# Patient Record
Sex: Male | Born: 1965 | Race: Black or African American | Hispanic: No | Marital: Single | Smoking: Current every day smoker
Health system: Southern US, Community
[De-identification: ages and names within clinical notes are randomized; demographics above are authoritative.]

## PROBLEM LIST (undated history)

## (undated) DIAGNOSIS — A539 Syphilis, unspecified: Secondary | ICD-10-CM

## (undated) DIAGNOSIS — M549 Dorsalgia, unspecified: Secondary | ICD-10-CM

## (undated) DIAGNOSIS — K859 Acute pancreatitis without necrosis or infection, unspecified: Secondary | ICD-10-CM

## (undated) DIAGNOSIS — F329 Major depressive disorder, single episode, unspecified: Secondary | ICD-10-CM

## (undated) DIAGNOSIS — I10 Essential (primary) hypertension: Secondary | ICD-10-CM

## (undated) DIAGNOSIS — Z21 Asymptomatic human immunodeficiency virus [HIV] infection status: Secondary | ICD-10-CM

## (undated) DIAGNOSIS — B20 Human immunodeficiency virus [HIV] disease: Secondary | ICD-10-CM

## (undated) DIAGNOSIS — F32A Depression, unspecified: Secondary | ICD-10-CM

## (undated) DIAGNOSIS — N182 Chronic kidney disease, stage 2 (mild): Secondary | ICD-10-CM

---

## 2001-10-25 ENCOUNTER — Emergency Department (HOSPITAL_COMMUNITY): Admission: EM | Admit: 2001-10-25 | Discharge: 2001-10-25 | Payer: Self-pay | Admitting: *Deleted

## 2004-09-11 ENCOUNTER — Emergency Department: Payer: Self-pay | Admitting: Emergency Medicine

## 2004-09-14 ENCOUNTER — Emergency Department: Payer: Self-pay | Admitting: Emergency Medicine

## 2005-01-03 ENCOUNTER — Emergency Department: Payer: Self-pay | Admitting: Emergency Medicine

## 2005-01-07 ENCOUNTER — Emergency Department: Payer: Self-pay | Admitting: General Practice

## 2005-06-22 ENCOUNTER — Emergency Department: Payer: Self-pay | Admitting: Emergency Medicine

## 2005-06-26 ENCOUNTER — Emergency Department: Payer: Self-pay | Admitting: Emergency Medicine

## 2005-11-09 ENCOUNTER — Emergency Department: Payer: Self-pay | Admitting: Emergency Medicine

## 2006-03-26 ENCOUNTER — Emergency Department: Payer: Self-pay | Admitting: Emergency Medicine

## 2006-03-26 ENCOUNTER — Other Ambulatory Visit: Payer: Self-pay

## 2006-07-11 ENCOUNTER — Emergency Department (HOSPITAL_COMMUNITY): Admission: EM | Admit: 2006-07-11 | Discharge: 2006-07-11 | Payer: Self-pay | Admitting: Emergency Medicine

## 2006-09-10 ENCOUNTER — Emergency Department: Payer: Self-pay | Admitting: Emergency Medicine

## 2006-10-19 ENCOUNTER — Emergency Department: Payer: Self-pay | Admitting: Emergency Medicine

## 2009-09-22 LAB — CONVERTED CEMR LAB
Cholesterol: 182 mg/dL
HDL: 18 mg/dL
LDL Cholesterol: 141 mg/dL
Triglycerides: 115 mg/dL

## 2009-09-23 LAB — CONVERTED CEMR LAB
CD4 Count: 707 microliters
HIV-2 Ab: POSITIVE
Hepatitis B Surface Ag: NEGATIVE

## 2009-09-26 LAB — CONVERTED CEMR LAB

## 2009-10-02 LAB — CONVERTED CEMR LAB
Iron: 27 ug/dL
Saturation Ratios: 18 %
TIBC: 148 ug/dL

## 2009-10-03 LAB — CONVERTED CEMR LAB
INR: 1.1
Prothrombin Time: 11 s
aPTT: 37 s

## 2009-10-05 LAB — CONVERTED CEMR LAB
ALT: 56 units/L
AST: 32 units/L
Albumin: 1.9 g/dL
Alkaline Phosphatase: 337 units/L
BUN: 5 mg/dL
CO2: 27 meq/L
Calcium: 8 mg/dL
Chloride: 106 meq/L
Creatinine, Ser: 1.3 mg/dL
Glucose, Bld: 93 mg/dL
HCT: 24.3 %
Hemoglobin: 8.1 g/dL
LDH: 167 units/L
Lipase: 169 units/L
Platelets: 270 10*3/uL
Potassium: 3.9 meq/L
Sodium: 139 meq/L
Total Bilirubin: 0.3 mg/dL
Total Protein: 6.4 g/dL
WBC: 7.8 10*3/uL

## 2009-11-04 ENCOUNTER — Encounter: Payer: Self-pay | Admitting: Family Medicine

## 2009-11-04 ENCOUNTER — Ambulatory Visit: Payer: Self-pay | Admitting: Family Medicine

## 2009-11-04 DIAGNOSIS — B20 Human immunodeficiency virus [HIV] disease: Secondary | ICD-10-CM | POA: Insufficient documentation

## 2009-11-04 DIAGNOSIS — I10 Essential (primary) hypertension: Secondary | ICD-10-CM

## 2009-11-04 DIAGNOSIS — A539 Syphilis, unspecified: Secondary | ICD-10-CM

## 2009-11-04 DIAGNOSIS — F329 Major depressive disorder, single episode, unspecified: Secondary | ICD-10-CM

## 2009-11-04 LAB — CONVERTED CEMR LAB: Iron: 27 ug/dL

## 2009-11-21 ENCOUNTER — Encounter: Payer: Self-pay | Admitting: Internal Medicine

## 2009-11-24 ENCOUNTER — Ambulatory Visit: Payer: Self-pay | Admitting: Internal Medicine

## 2009-11-24 ENCOUNTER — Encounter (INDEPENDENT_AMBULATORY_CARE_PROVIDER_SITE_OTHER): Payer: Self-pay | Admitting: *Deleted

## 2009-11-24 ENCOUNTER — Ambulatory Visit: Payer: Self-pay | Admitting: Family Medicine

## 2009-11-24 LAB — CONVERTED CEMR LAB: LDL Cholesterol: 121 mg/dL — ABNORMAL HIGH (ref 0–99)

## 2009-12-02 LAB — CONVERTED CEMR LAB
AST: 21 units/L (ref 0–37)
Albumin: 4.7 g/dL (ref 3.5–5.2)
BUN: 21 mg/dL (ref 6–23)
CO2: 28 meq/L (ref 19–32)
Calcium: 9.8 mg/dL (ref 8.4–10.5)
Chlamydia, Swab/Urine, PCR: NEGATIVE
Chloride: 102 meq/L (ref 96–112)
Eosinophils Absolute: 0.4 10*3/uL (ref 0.0–0.7)
Eosinophils Relative: 7 % — ABNORMAL HIGH (ref 0–5)
GC Probe Amp, Urine: NEGATIVE
HCT: 42.7 % (ref 39.0–52.0)
HIV-1 antibody: POSITIVE — AB
HIV-2 Ab: UNDETERMINED — AB
Hemoglobin: 13.6 g/dL (ref 13.0–17.0)
Hep B S Ab: POSITIVE — AB
Hepatitis B Surface Ag: NEGATIVE
Lymphocytes Relative: 50 % — ABNORMAL HIGH (ref 12–46)
Lymphs Abs: 2.8 10*3/uL (ref 0.7–4.0)
MCV: 90.3 fL (ref >=78.0)
Monocytes Absolute: 0.5 10*3/uL (ref 0.1–1.0)
Monocytes Relative: 8 % (ref 3–12)
Platelets: 201 10*3/uL (ref 150–400)
Potassium: 4 meq/L (ref 3.5–5.3)
RBC: 4.73 M/uL (ref 4.22–5.81)
RPR Titer: 1:16 {titer} — AB
WBC: 5.6 10*3/uL (ref 4.0–10.5)

## 2009-12-03 ENCOUNTER — Emergency Department (HOSPITAL_COMMUNITY): Admission: EM | Admit: 2009-12-03 | Discharge: 2009-12-03 | Payer: Self-pay | Admitting: Family Medicine

## 2009-12-08 ENCOUNTER — Ambulatory Visit: Payer: Self-pay | Admitting: Internal Medicine

## 2009-12-08 DIAGNOSIS — Z8719 Personal history of other diseases of the digestive system: Secondary | ICD-10-CM | POA: Insufficient documentation

## 2009-12-11 ENCOUNTER — Ambulatory Visit: Payer: Self-pay | Admitting: Family Medicine

## 2009-12-11 DIAGNOSIS — M549 Dorsalgia, unspecified: Secondary | ICD-10-CM | POA: Insufficient documentation

## 2009-12-17 ENCOUNTER — Encounter: Payer: Self-pay | Admitting: Internal Medicine

## 2009-12-18 ENCOUNTER — Ambulatory Visit: Payer: Self-pay | Admitting: Family Medicine

## 2009-12-24 ENCOUNTER — Encounter: Admission: RE | Admit: 2009-12-24 | Discharge: 2010-02-10 | Payer: Self-pay | Admitting: Family Medicine

## 2010-01-07 ENCOUNTER — Ambulatory Visit: Payer: Self-pay | Admitting: Family Medicine

## 2010-01-07 ENCOUNTER — Encounter: Payer: Self-pay | Admitting: Family Medicine

## 2010-01-13 ENCOUNTER — Encounter: Payer: Self-pay | Admitting: Family Medicine

## 2010-01-14 ENCOUNTER — Ambulatory Visit: Payer: Self-pay | Admitting: Family Medicine

## 2010-02-06 ENCOUNTER — Encounter: Payer: Self-pay | Admitting: Family Medicine

## 2010-02-25 ENCOUNTER — Ambulatory Visit: Payer: Self-pay | Admitting: Internal Medicine

## 2010-02-25 LAB — CONVERTED CEMR LAB
HIV 1 RNA Quant: 7780 copies/mL — ABNORMAL HIGH (ref <48)
HIV-1 RNA Quant, Log: 3.89 — ABNORMAL HIGH (ref <1.68)

## 2010-02-26 LAB — CONVERTED CEMR LAB
BUN: 13 mg/dL (ref 6–23)
Basophils Relative: 1 % (ref 0–1)
CO2: 24 meq/L (ref 19–32)
Creatinine, Ser: 1.26 mg/dL (ref 0.40–1.50)
Eosinophils Absolute: 0.2 10*3/uL (ref 0.0–0.7)
Glucose, Bld: 109 mg/dL — ABNORMAL HIGH (ref 70–99)
Hemoglobin: 12.3 g/dL — ABNORMAL LOW (ref 13.0–17.0)
MCHC: 32.6 g/dL (ref 30.0–36.0)
MCV: 94.5 fL (ref 78.0–100.0)
Monocytes Absolute: 0.4 10*3/uL (ref 0.1–1.0)
Monocytes Relative: 8 % (ref 3–12)
RBC: 3.99 M/uL — ABNORMAL LOW (ref 4.22–5.81)
RPR Ser Ql: REACTIVE — AB
RPR Titer: 1:2 {titer}
T pallidum Antibodies (TP-PA): >8 — ABNORMAL HIGH (ref <0.90)
Total Bilirubin: 0.6 mg/dL (ref 0.3–1.2)
Total Protein: 8.2 g/dL (ref 6.0–8.3)

## 2010-03-11 ENCOUNTER — Ambulatory Visit: Payer: Self-pay | Admitting: Internal Medicine

## 2010-03-19 ENCOUNTER — Encounter: Payer: Self-pay | Admitting: Internal Medicine

## 2010-04-27 ENCOUNTER — Ambulatory Visit: Payer: Self-pay | Admitting: Internal Medicine

## 2010-04-27 LAB — CONVERTED CEMR LAB
ALT: 22 units/L (ref 0–53)
Alkaline Phosphatase: 38 units/L — ABNORMAL LOW (ref 39–117)
Basophils Absolute: 0 10*3/uL (ref 0.0–0.1)
CO2: 27 meq/L (ref 19–32)
Eosinophils Relative: 4 % (ref 0–5)
HCT: 41.3 % (ref 39.0–52.0)
HIV 1 RNA Quant: 9440 copies/mL — ABNORMAL HIGH (ref <48)
Lymphocytes Relative: 47 % — ABNORMAL HIGH (ref 12–46)
Platelets: 183 10*3/uL (ref 150–400)
RDW: 12.6 % (ref 11.5–15.5)
Sodium: 144 meq/L (ref 135–145)
Total Bilirubin: 0.4 mg/dL (ref 0.3–1.2)
Total Protein: 7.9 g/dL (ref 6.0–8.3)

## 2010-05-11 ENCOUNTER — Ambulatory Visit: Payer: Self-pay | Admitting: Internal Medicine

## 2010-05-15 ENCOUNTER — Encounter (INDEPENDENT_AMBULATORY_CARE_PROVIDER_SITE_OTHER): Payer: Self-pay | Admitting: *Deleted

## 2010-05-21 ENCOUNTER — Telehealth (INDEPENDENT_AMBULATORY_CARE_PROVIDER_SITE_OTHER): Payer: Self-pay | Admitting: *Deleted

## 2010-06-24 ENCOUNTER — Ambulatory Visit: Payer: Self-pay | Admitting: Internal Medicine

## 2010-06-24 ENCOUNTER — Telehealth (INDEPENDENT_AMBULATORY_CARE_PROVIDER_SITE_OTHER): Payer: Self-pay | Admitting: *Deleted

## 2010-06-24 LAB — CONVERTED CEMR LAB
ALT: 60 units/L — ABNORMAL HIGH (ref 0–53)
AST: 36 units/L (ref 0–37)
Basophils Absolute: 0 10*3/uL (ref 0.0–0.1)
Basophils Relative: 1 % (ref 0–1)
Creatinine, Ser: 1.35 mg/dL (ref 0.40–1.50)
Eosinophils Absolute: 0.2 10*3/uL (ref 0.0–0.7)
Eosinophils Relative: 5 % (ref 0–5)
HCT: 38.6 % — ABNORMAL LOW (ref 39.0–52.0)
Hemoglobin: 12.8 g/dL — ABNORMAL LOW (ref 13.0–17.0)
MCHC: 33.2 g/dL (ref 30.0–36.0)
MCV: 92.1 fL (ref 78.0–100.0)
Monocytes Absolute: 0.4 10*3/uL (ref 0.1–1.0)
RDW: 12.6 % (ref 11.5–15.5)
Total Bilirubin: 0.5 mg/dL (ref 0.3–1.2)

## 2010-07-16 ENCOUNTER — Ambulatory Visit: Payer: Self-pay | Admitting: Internal Medicine

## 2010-07-21 ENCOUNTER — Encounter: Payer: Self-pay | Admitting: Internal Medicine

## 2010-08-24 ENCOUNTER — Ambulatory Visit: Payer: Self-pay | Admitting: Internal Medicine

## 2010-09-12 IMAGING — CR DG LUMBAR SPINE COMPLETE 4+V
5 series · 5 of 5 positions shown · non-contrast
Comparison: None

CLINICAL DATA: Low back pain after lifting

LUMBAR SPINE - COMPLETE 4+ VIEW

[view not recorded (1 of 5)]
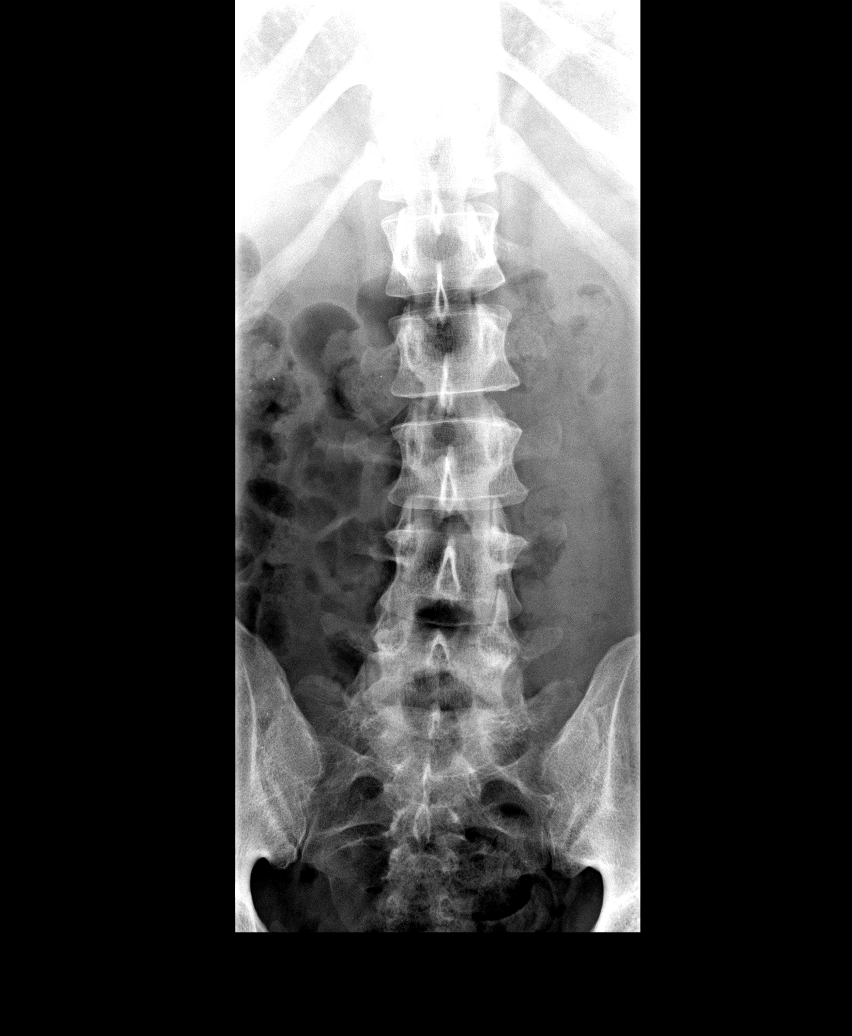

[view not recorded (2 of 5)]
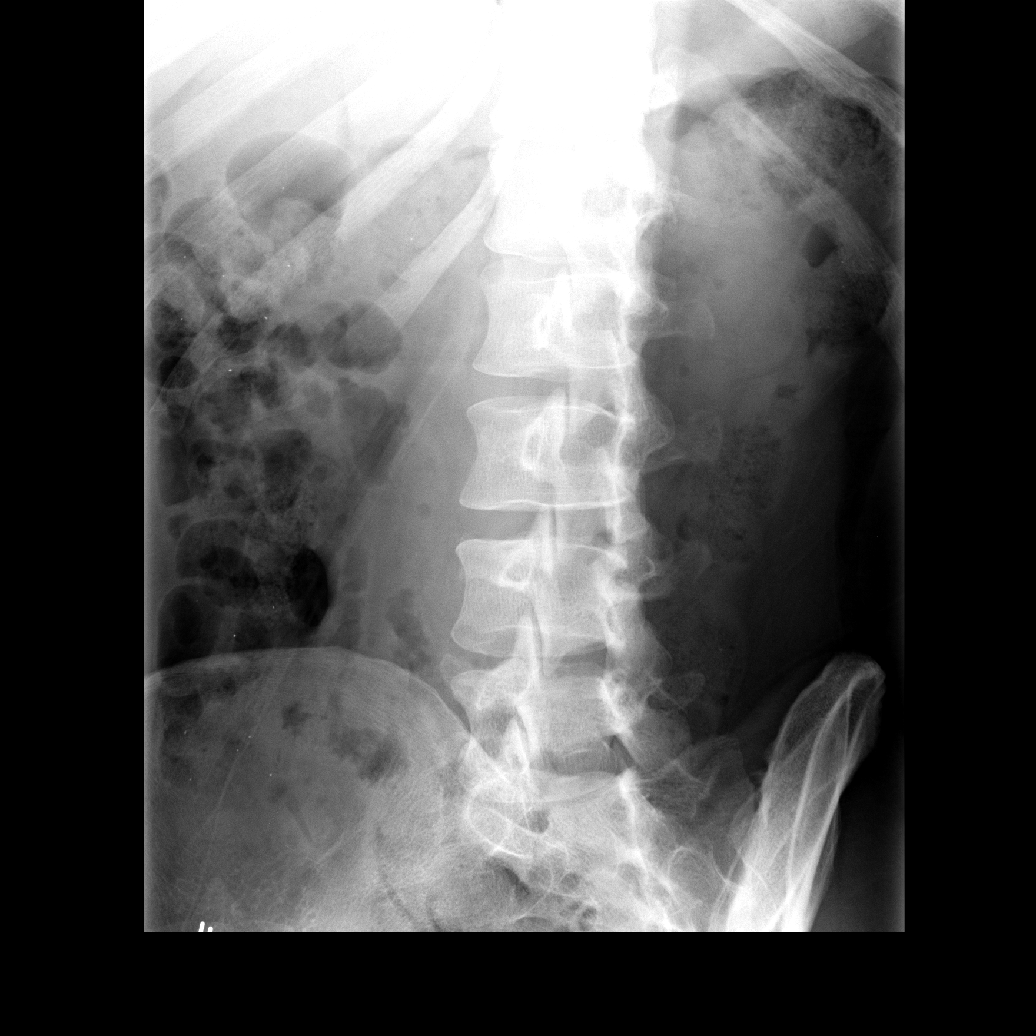

[view not recorded (3 of 5)]
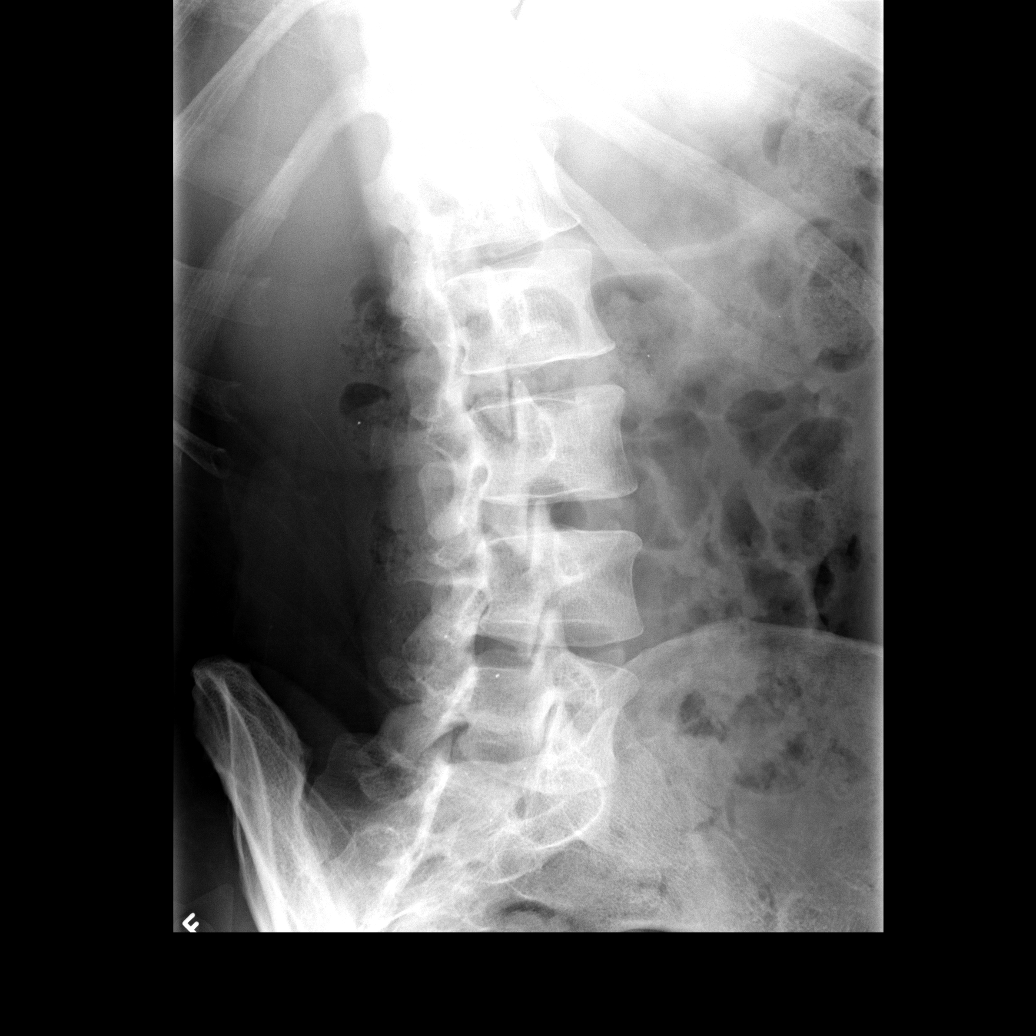

[view not recorded (4 of 5)]
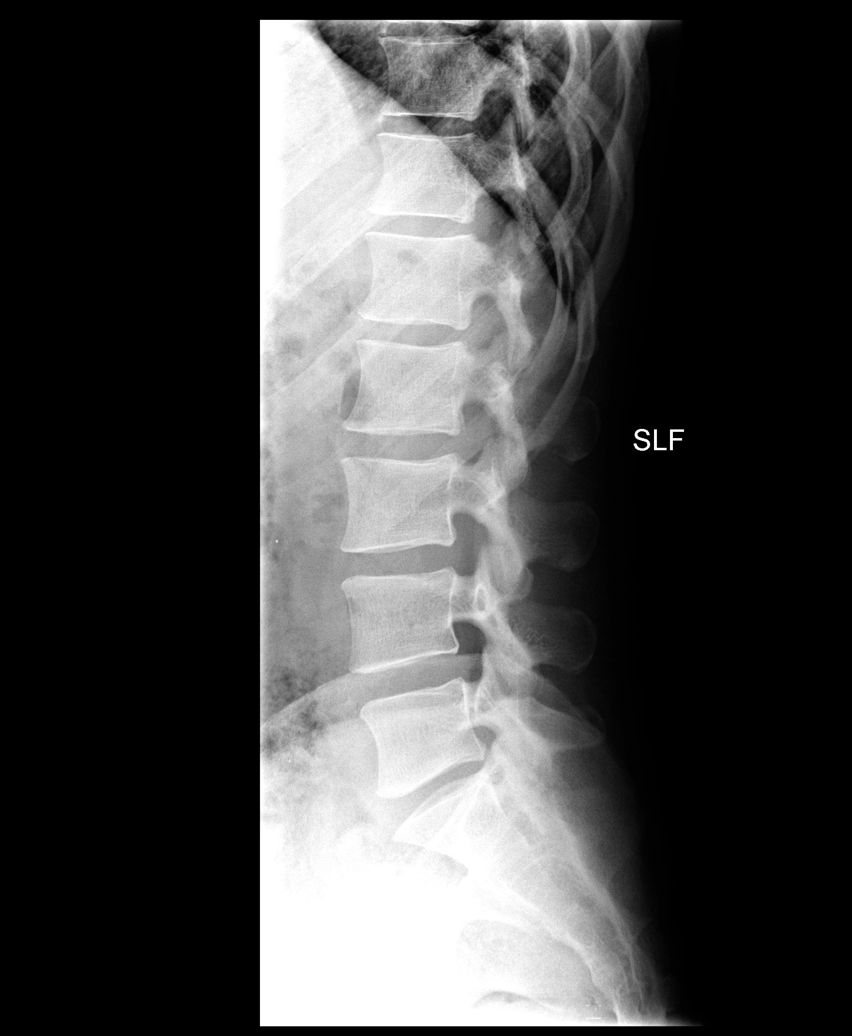

[view not recorded (5 of 5)]
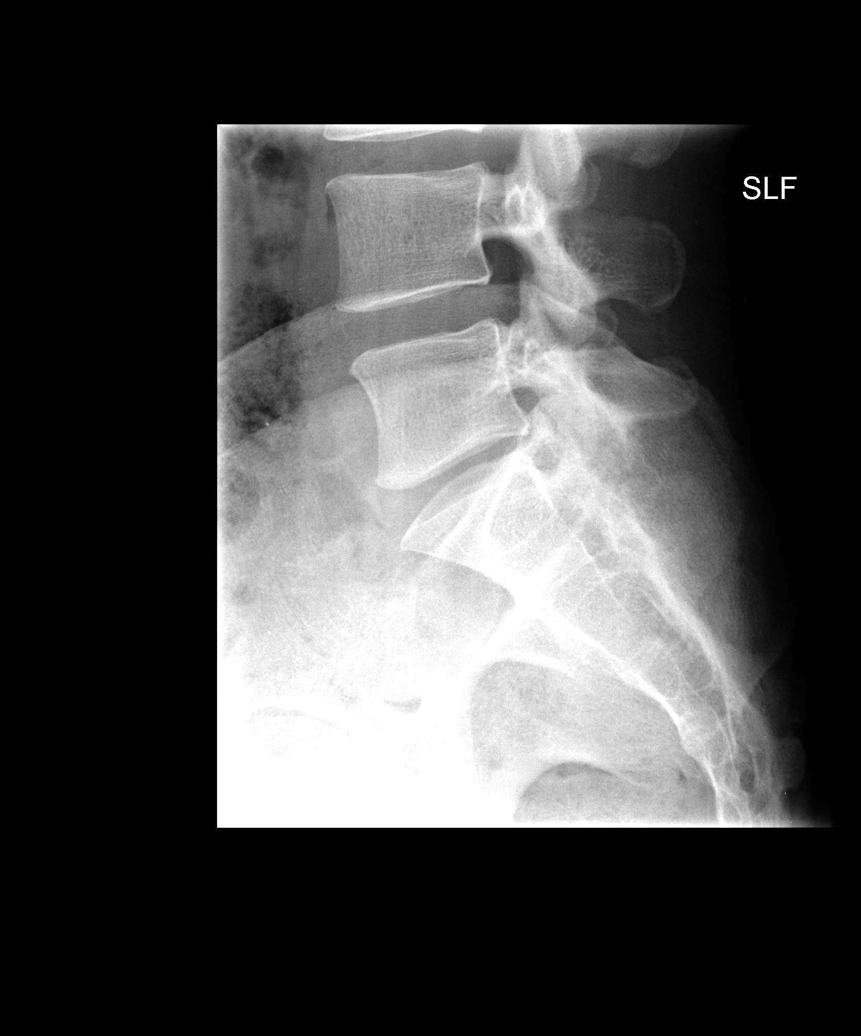

[5 of 5 positions shown; findings below may reference images not displayed]

FINDINGS: There is mild levoscoliosis.  Disc height preserved.  No
anomalies or acute bony changes.  Soft tissues unremarkable.
IMPRESSION: Normal except for mild levoscoliosis.

## 2010-09-21 ENCOUNTER — Ambulatory Visit: Admit: 2010-09-21 | Payer: Self-pay | Admitting: Internal Medicine

## 2010-10-04 ENCOUNTER — Encounter: Payer: Self-pay | Admitting: Family Medicine

## 2010-10-13 NOTE — Miscellaneous (Signed)
Summary: clinical update/ryan white Medicaid pending  Clinical Lists Changes  Observations: Added new observation of PAYOR: No Insurance (11/24/2009 11:18) Added new observation of RWTITLE: B (11/24/2009 11:18) Added new observation of AIDSDAP: No (11/24/2009 11:18) Added new observation of INCOMESOURCE: NONE (11/24/2009 11:18) Added new observation of HOUSEINCOME: 0  (11/24/2009 11:18) Added new observation of #CHILD<18 IN: No  (11/24/2009 11:18) Added new observation of FAMILYSIZE: 1  (11/24/2009 11:18) Added new observation of HOUSING: Stable/permanent  (11/24/2009 11:18) Added new observation of FINASSESSDT: 11/24/2009  (11/24/2009 11:18) Added new observation of YEARLYEXPEN: 0  (11/24/2009 11:18) Added new observation of GENDER: Male  (11/24/2009 11:18) Added new observation of MARITAL STAT: Single  (11/24/2009 11:18) Added new observation of LATINO/HISP: No  (11/24/2009 11:18) Added new observation of RACE: African American  (11/24/2009 11:18) Added new observation of REC_MESSAGE: Yes  (11/24/2009 11:18) Added new observation of RECPHONECALL: Yes  (11/24/2009 11:18) Added new observation of REC_MAIL: Yes  (11/24/2009 11:18) Added new observation of RW VITAL STA: Active  (11/24/2009 11:18) Added new observation of PATNTCOUNTY: [place]  (11/24/2009 11:18) Added new observation of RWPARTICIP: Yes  (11/24/2009 11:18)

## 2010-10-13 NOTE — Letter (Signed)
Summary: [facility]  [address]   [place], [place] [address]   Phone: [phone]  Fax: [phone]            07/16/2010   To Whom It May Concern:  Eddie Malone (DOB 18-Aug-1966) is a patient of mine. He has several medical conditions including HIV infection, Hypertension, Low Back Pain, Depression and Fatigue.  Sincerely,   Yisroel Ramming MD

## 2010-10-13 NOTE — Assessment & Plan Note (Signed)
Summary: F/U/VS   Primary Isidro Monks:  Marisue Ivan  MD  CC:  follow-up visit, lab results, B/P elevated, and pt. applying for disability and would like to discuss this.  History of Present Illness: Pt never started his Atripla but is going to as soon as he picks it up.  He c/o fatigue, depression and inablility to concentrate. He stopped seeing his Pyschiatrist. I encouraged him to schedule an appt. Pt has not taken his BP medication yet today. He is applying for disability and needs a letter listing his medical conditions.  Depression History:      The patient denies a depressed mood most of the day and a diminished interest in his usual daily activities.        The patient denies that he feels like life is not worth living, denies that he wishes that he were dead, and denies that he has thought about ending his life.        Preventive Screening-Counseling & Management  Alcohol-Tobacco     Alcohol drinks/day: 0     Smoking Status: current     Smoking Cessation Counseling: yes     Packs/Day: <0.25     Tobacco Counseling: to quit use of tobacco products  Caffeine-Diet-Exercise     Caffeine use/day: yes     Does Patient Exercise: yes     Type of exercise: walking     Exercise (avg: min/session): >60     Times/week: 5  Hep-HIV-STD-Contraception     HIV Risk: risk noted  Safety-Violence-Falls     Seat Belt Use: yes      Sexual History:  currently monogamous.        Drug Use:  never.    Comments: pt. given condoms   Updated Prior Medication List: LISINOPRIL 5 MG TABS (LISINOPRIL) 1 tab by mouth daily PAXIL 20 MG TABS (PAROXETINE HCL) 1 tab by mouth at bedtime ATRIPLA 600-200-300 MG TABS (EFAVIRENZ-EMTRICITAB-TENOFOVIR) Take 1 tablet by mouth at bedtime  Current Allergies (reviewed today): No known allergies  Past History:  Past Medical History: Last updated: 11/04/2009 HIV- dx 09/2009(CD4 1200 - 09/2009) HTN- dx 09/2009 Hx of Syphillis Depression Sickle  Cell???  Review of Systems  The patient denies anorexia, fever, dyspnea on exertion, and prolonged cough.    Vital Signs:  Patient profile:   45 year old male Height:      68.25 inches (173.35 cm) Weight:      184.0 pounds (83.64 kg) BMI:     27.87 Temp:     97.5 degrees F (36.39 degrees C) oral Pulse rate:   76 / minute BP sitting:   168 / 95  (right arm)  Vitals Entered By: Wendall Mola CMA Duncan Dull) (July 16, 2010 10:15 AM) CC: follow-up visit, lab results, B/P elevated, pt. applying for disability and would like to discuss this Is Patient Diabetic? No Pain Assessment Patient in pain? no      Nutritional Status BMI of 25 - 29 = overweight Nutritional Status Detail appetite "so-so"  Have you ever been in a relationship where you felt threatened, hurt or afraid?Unable to ask   Does patient need assistance? Functional Status Self care Ambulation Normal Comments no missed doses of meds per pt.   Physical Exam  General:  alert, well-developed, well-nourished, and well-hydrated.   Head:  normocephalic and atraumatic.   Mouth:  pharynx pink and moist.   Lungs:  normal breath sounds.     Impression & Recommendations:  Problem #  1:  HIV INFECTION (ICD-042) Pt has not started his Atripla.  He states that he plans on starting. Will have him return in 6 weeks for labs.  Diagnostics Reviewed:  HIV: HIV positive - not AIDS (12/08/2009)   HIV-Western blot: Positive (11/24/2009)   CD4: 760 (06/25/2010)   WBC: 4.3 (06/24/2010)   Hgb: 12.8 (06/24/2010)   HCT: 38.6 (06/24/2010)   Platelets: 179 (06/24/2010) HIV genotype: * (11/24/2009)   HIV-1 RNA: 48500 (06/24/2010)   HBSAg: NEG (11/24/2009)  Orders: Est. Patient Level III (99213)Future Orders: T-CD4SP ([facility]) (CD4SP) ... 08/27/2010 T-HIV Viral Load [id number]) ... 08/27/2010 T-Comprehensive Metabolic Panel [id number]) ... 08/27/2010 T-CBC w/Diff ([id number]) ... 08/27/2010  Problem # 2:  ESSENTIAL  HYPERTENSION, BENIGN (ICD-401.1) Pt encouraged to take his BP medication - elevated today. His updated medication list for this problem includes:    Lisinopril 5 Mg Tabs (Lisinopril) .Marland Kitchen... 1 tab by mouth daily  Other Orders: TB Skin Test [id number]) Admin 1st Vaccine (84132)  Patient Instructions: 1)  Please schedule a follow-up appointment in 8 weeks, 2 weeks after labs.        Immunization History:  Influenza Immunization History:    Influenza:  historical (07/14/2010)  Immunizations Administered:  PPD Skin Test:    Vaccine Type: PPD    Site: left forearm    Mfr: Sanofi Pasteur    Dose: 0.1 ml    Route: ID    Given by: Wendall Mola CMA ( AAMA)    Exp. Date: 07/16/2011    Lot #: [id number]  Appended Document: F/U/VS   PPD Results    Date of reading: 07/21/2010    Results: < 5mm    Interpretation: negative

## 2010-10-13 NOTE — Miscellaneous (Signed)
Summary: [facility]  [facility]   Imported By: Florinda Marker 03/19/2010 14:11:20  _____________________________________________________________________  External Attachment:    Type:   Image     Comment:   External Document

## 2010-10-13 NOTE — Assessment & Plan Note (Signed)
Summary: 48month f/u/vs   Primary Provider:  Marisue Ivan  MD  CC:  follow-up visit.  History of Present Illness: Pt doing well.  He is here to get lab results.  Preventive Screening-Counseling & Management  Alcohol-Tobacco     Alcohol drinks/day: 0     Smoking Status: current     Smoking Cessation Counseling: yes     Packs/Day: <0.25     Tobacco Counseling: to quit use of tobacco products  Caffeine-Diet-Exercise     Caffeine use/day: yes     Does Patient Exercise: yes     Type of exercise: walking     Exercise (avg: min/session): >60     Times/week: 5  Hep-HIV-STD-Contraception     HIV Risk: no risk noted  Safety-Violence-Falls     Seat Belt Use: yes  Comments: given condoms      Sexual History:  currently monogamous.        Drug Use:  never.     Current Allergies (reviewed today): No known allergies  Past History:  Past Medical History: Last updated: 11/04/2009 HIV- dx 09/2009(CD4 1200 - 09/2009) HTN- dx 09/2009 Hx of Syphillis Depression Sickle Cell???  Social History: Sexual History:  currently monogamous Drug Use:  never  Review of Systems  The patient denies anorexia, fever, and weight loss.    Vital Signs:  Patient profile:   44 year old male Height:      68.25 inches (173.35 cm) Weight:      173.75 pounds (78.98 kg) BMI:     26.32 Temp:     98.2 degrees F (36.78 degrees C) oral Pulse rate:   60 / minute BP sitting:   133 / 78  (left arm) Cuff size:   large  Vitals Entered By: Jennet Maduro RN (March 11, 2010 9:35 AM) CC: follow-up visit Is Patient Diabetic? No Pain Assessment Patient in pain? no      Nutritional Status BMI of 19 -24 = normal Nutritional Status Detail appetite "good"  Have you ever been in a relationship where you felt threatened, hurt or afraid?not asked friend present   Does patient need assistance? Functional Status Self care Ambulation Normal   Physical Exam  General:  alert, well-developed,  well-nourished, and well-hydrated.   Head:  normocephalic and atraumatic.   Lungs:  normal breath sounds.          Medication Adherence: 03/11/2010   Adherence to medications reviewed with patient. Counseling to provide adequate adherence provided                                Impression & Recommendations:  Problem # 1:  HIV INFECTION (ICD-042) Pt concerned about drop in CD4ct.  I discussed the START study with him but he would prefer to go ahead and start therapy.  We discussed treatment options. He would like to start Atripla. I will refer him to Byrd Hesselbach to get enrolled in ADAP. Potential side effects were discussed. He will return in 6 weeks for repeat labs. Diagnostics Reviewed:  HIV: HIV positive - not AIDS (12/08/2009)   HIV-Western blot: Positive (11/24/2009)   CD4: 700 (02/26/2010)   WBC: 5.0 (02/25/2010)   Hgb: 12.3 (02/25/2010)   HCT: 37.7 (02/25/2010)   Platelets: 213 (02/25/2010) HIV genotype: * (11/24/2009)   HIV-1 RNA: 7780 (02/25/2010)   HBSAg: NEG (11/24/2009)  Orders: Est. Patient Level IV (99214)Future Orders: T-CD4SP ([facility]) (CD4SP) ... 04/22/2010 T-HIV Viral  Load [id number]) ... 04/22/2010 T-Comprehensive Metabolic Panel [id number]) ... 04/22/2010 T-CBC w/Diff ([id number]) ... 04/22/2010  Problem # 2:  Hx of SYPHILIS (ICD-097.9) RPR decreasing  Medications Added to Medication List This Visit: 1)  Atripla 600-200-300 Mg Tabs (Efavirenz-emtricitab-tenofovir) .... Take 1 tablet by mouth at bedtime  Patient Instructions: 1)  Please schedule a follow-up appointment in 8 weeks, 2 weeks after labs.  Prescriptions: ATRIPLA 600-200-300 MG TABS (EFAVIRENZ-EMTRICITAB-TENOFOVIR) Take 1 tablet by mouth at bedtime  #30 x 5   Entered and Authorized by:   Yisroel Ramming MD   Signed by:   Yisroel Ramming MD on 03/11/2010   Method used:   Print then Give to Patient   RxID:   [id number]

## 2010-10-13 NOTE — Miscellaneous (Signed)
Summary: [facility]   Imported By: Florinda Marker 12/17/2009 11:40:49  _____________________________________________________________________  External Attachment:    Type:   Image     Comment:   External Document

## 2010-10-13 NOTE — Progress Notes (Signed)
Summary: ADAP  Phone Note Other Incoming   Summary of Call: Pt visited clinic stating they didn't get their ADAP medications.  Note says [facility] couldn't reach number.  Contacted [facility] and let them speak to patient.  to update his information and get his medication delivered. Initial call taken by: Altamease Oiler,  June 24, 2010 10:08 AM

## 2010-10-13 NOTE — Miscellaneous (Signed)
Summary: HIPAA Restrictions  HIPAA Restrictions   Imported By: Florinda Marker 11/25/2009 09:57:27  _____________________________________________________________________  External Attachment:    Type:   Image     Comment:   External Document

## 2010-10-13 NOTE — Miscellaneous (Signed)
Summary: Orders   Clinical Lists Changes  Problems: Added new problem of ENCOUNTER FOR LONG-TERM USE OF OTHER MEDICATIONS (ICD-V58.69) Orders: Added new Test order of T-HIV1 Quant rflx Ultra or Genotype ([id number]) - Signed Added new Test order of T-Lipid Profile [id number]) - Signed     Process Orders Check Orders Results:     [facility]: ABN not required for this insurance Tests Sent for requisitioning (November 24, 2009 2:43 PM):     11/24/2009: [facility] -- T-HIV1 Quant rflx Ultra or Genotype [phone] (signed)     11/24/2009: [facility] -- T-Lipid Profile [id number] (signed)

## 2010-10-13 NOTE — Assessment & Plan Note (Signed)
Summary: f/u Depression - d/c'd celexa, started on paxil   Vital Signs:  Patient profile:   45 year old male Height:      68.25 inches Weight:      166.6 pounds BMI:     25.24 Temp:     98.2 degrees F oral Pulse rate:   74 / minute BP sitting:   130 / 78  (left arm) Cuff size:   regular  Vitals Entered By: Gladstone Pih (November 24, 2009 4:14 PM) CC: F/U apt, c/o Citalopran has "stomach in a knot" Is Patient Diabetic? No Pain Assessment Patient in pain? no        Primary Care Provider:  Marisue Ivan  MD  CC:  F/U apt and c/o Citalopran has "stomach in a knot".  History of Present Illness: 45yo M here for f/u after starting Celexa  Depression: Started on Celexa on 2/22.  States that he can tell an improvement in his depression since starting the medication but at the expense of having abd cramps and diarrhea.  No suicidal or homicidal ideations.  HIV: Pt has been contacted by the ID clinic and has an initial appt later this month.  Denies any fevers, chills, skin lesion, or other symptoms besides the new onset of diarrhea since starting the Celexa.    HTN: No adverse effects from medication.  Not checking it regularly.  Was well controlled at last visit.  No dizziness, HA, CP, palpitations, or swelling.     Habits & Providers  Alcohol-Tobacco-Diet     Tobacco Status: never  Current Medications (verified): 1)  Lisinopril 5 Mg Tabs (Lisinopril) .Marland Kitchen.. 1 Tab By Mouth Daily 2)  Celexa 20 Mg Tabs (Citalopram Hydrobromide) .... Take 1/2 Tab Daily For 1 Week and Then Stop 3)  Paxil 20 Mg Tabs (Paroxetine Hcl) .Marland Kitchen.. 1 Tab By Mouth At Bedtime  Allergies (verified): No Known Drug Allergies  Review of Systems       Denies any fevers, chills, skin lesion, No dizziness, HA, CP, palpitations, or swelling.   Physical Exam  General:  VS Reviewed. Well appearing, NAD.  Lungs:  Normal respiratory effort, chest expands symmetrically. Lungs are clear to auscultation, no  crackles or wheezes. Heart:  Normal rate and regular rhythm. S1 and S2 normal without gallop, murmur, click, rub or other extra sounds. Skin:  hyperpigmented macule lesion in a symmetric pattern covering the entire back; possible herald patch on lower right flank- unchanged since last visit Cervical Nodes:  large, nontender lymph nodes along ant cervical chain Psych:  no suicidal or homicidal ideations   Impression & Recommendations:  Problem # 1:  DEPRESSION (ICD-311) Assessment Improved Celexa was effective in improving his depression but he experience GI adverse effects.  Plan to take him off the Celexa over the next week and start Paxil.  Will f/u in 2 weeks to ensure that the he responds to the paxil.    His updated medication list for this problem includes:    Celexa 20 Mg Tabs (Citalopram hydrobromide) .Marland Kitchen... Take 1/2 tab daily for 1 week and then stop    Paxil 20 Mg Tabs (Paroxetine hcl) .Marland Kitchen... 1 tab by mouth at bedtime  Orders: [facility]- Est  Level 4 (81829)  Problem # 2:  ESSENTIAL HYPERTENSION, BENIGN (ICD-401.1) Assessment: Unchanged At goal (<140/90). No changes to regimen.  His updated medication list for this problem includes:    Lisinopril 5 Mg Tabs (Lisinopril) .Marland Kitchen... 1 tab by mouth daily  Orders: [facility]- Est  Level 4 (24401)  Problem # 3:  HIV INFECTION (ICD-042) Assessment: Comment Only Outside records reviewed and Larita Fife has sent them to the ID clinic. He has his initial visit later this month.  Orders: [facility]- Est  Level 4 (02725)  Complete Medication List: 1)  Lisinopril 5 Mg Tabs (Lisinopril) .Marland Kitchen.. 1 tab by mouth daily 2)  Celexa 20 Mg Tabs (Citalopram hydrobromide) .... Take 1/2 tab daily for 1 week and then stop 3)  Paxil 20 Mg Tabs (Paroxetine hcl) .Marland Kitchen.. 1 tab by mouth at bedtime  Patient Instructions: 1)  Please schedule a follow-up appointment in 2 weeks.  2)  Take the celexa  1/2 tab one more week and then stop. 3)  Start the paxil today and call me before  the 2 weeks if you have side effects. Prescriptions: PAXIL 20 MG TABS (PAROXETINE HCL) 1 tab by mouth at bedtime  #30 x 1   Entered and Authorized by:   Marisue Ivan  MD   Signed by:   Marisue Ivan  MD on 11/24/2009   Method used:   Electronically to        [facility] [phone]* (retail)       [address]       [place], [place]  [address]       Ph: [phone]       Fax: [phone]   RxID:   [id number]     Prevention & Chronic Care Immunizations   Influenza vaccine: Not documented   Influenza vaccine deferral: Not indicated  (11/04/2009)    Tetanus booster: Not documented    Pneumococcal vaccine: Not documented  Other Screening   Smoking status: never  (11/24/2009)  Lipids   Total Cholesterol: 182  (09/22/2009)   LDL: 141  (09/22/2009)   LDL Direct: Not documented   HDL: 18  (09/22/2009)   Triglycerides: 115  (09/22/2009)  Hypertension   Last Blood Pressure: 130 / 78  (11/24/2009)   Serum creatinine: 1.3  (10/05/2009)   Serum potassium 3.9  (10/05/2009)    Hypertension flowsheet reviewed?: Yes   Progress toward BP goal: At goal  Self-Management Support :   Personal Goals (by the next clinic visit) :      Personal blood pressure goal: 140/90  (11/04/2009)   Hypertension self-management support: BP self-monitoring log, Written self-care plan, Education handout  (11/04/2009)

## 2010-10-13 NOTE — Assessment & Plan Note (Signed)
Summary: f/u back pain- improved   Vital Signs:  Patient profile:   45 year old male Height:      68.25 inches Weight:      169.4 pounds BMI:     25.66 Temp:     98.1 degrees F oral Pulse rate:   69 / minute BP sitting:   115 / 74  (right arm) Cuff size:   regular  Vitals Entered By: Garen Grams LPN (December 18, 1608 4:19 PM) CC: f/u back pain Is Patient Diabetic? No Pain Assessment Patient in pain? no        Primary Care Provider:  Marisue Ivan  MD  CC:  f/u back pain.  History of Present Illness: 45yo M here for f/u back pain  Back pain: Improved s/p diclofenac and flexeril.  Has not undergone PT yet.  Denies any radiating neurological pain.  No red flag symptoms.  Pain is localized to lower lumbar region when it occurs.    Habits & Providers  Alcohol-Tobacco-Diet     Tobacco Status: never  Current Medications (verified): 1)  Lisinopril 5 Mg Tabs (Lisinopril) .Marland Kitchen.. 1 Tab By Mouth Daily 2)  Paxil 20 Mg Tabs (Paroxetine Hcl) .Marland Kitchen.. 1 Tab By Mouth At Bedtime 3)  Diclofenac Sodium 75 Mg Tbec (Diclofenac Sodium) .Marland Kitchen.. 1 Tablet By Mouth Two Times A Day For The Next 2 Weeks 4)  Cyclobenzaprine Hcl 10 Mg Tabs (Cyclobenzaprine Hcl) .... 1/2 -1 Tab By Mouth Three Times A Day As Needed For Muscle Spasm  Allergies (verified): No Known Drug Allergies  Review of Systems      See HPI  Physical Exam  General:  VS Reviewed. Well appearing, NAD.  Msk:  Back exam: Inspection: no obvious deformities, erythema, or edema Palpation: nontender ROM: full Neg straight leg raise sitting and supine  Neurologic:  5/5 strenght lower ext 1+ dtrs b/l patellar sensation grossly intact   Impression & Recommendations:  Problem # 1:  BACK PAIN, LUMBAR, WITH RADICULOPATHY (ICD-724.4) Assessment Improved  No radiating pain today.  Symptoms overall improved.  Will still undergo PT and f/u on as needed basis. No red flags therefore, no further imaging required.    His updated  medication list for this problem includes:    Diclofenac Sodium 75 Mg Tbec (Diclofenac sodium) .Marland Kitchen... 1 tablet by mouth two times a day for the next 2 weeks    Cyclobenzaprine Hcl 10 Mg Tabs (Cyclobenzaprine hcl) .Marland Kitchen... 1/2 -1 tab by mouth three times a day as needed for muscle spasm  Orders: [facility]- Est Level  3 (96045)  Complete Medication List: 1)  Lisinopril 5 Mg Tabs (Lisinopril) .Marland Kitchen.. 1 tab by mouth daily 2)  Paxil 20 Mg Tabs (Paroxetine hcl) .Marland Kitchen.. 1 tab by mouth at bedtime 3)  Diclofenac Sodium 75 Mg Tbec (Diclofenac sodium) .Marland Kitchen.. 1 tablet by mouth two times a day for the next 2 weeks 4)  Cyclobenzaprine Hcl 10 Mg Tabs (Cyclobenzaprine hcl) .... 1/2 -1 tab by mouth three times a day as needed for muscle spasm

## 2010-10-13 NOTE — Assessment & Plan Note (Signed)
Summary: back pain resolved   Vital Signs:  Patient profile:   45 year old male Height:      68.25 inches Weight:      173.1 pounds BMI:     26.22 Temp:     98.1 degrees F Pulse rate:   68 / minute BP sitting:   138 / 84  (left arm)  Vitals Entered By: Theresia Lo RN (Jan 14, 2010 3:33 PM) CC: follow up back pain Is Patient Diabetic? No   Primary Care Provider:  Marisue Ivan  MD  CC:  follow up back pain.  History of Present Illness: 45yo M here for f/u of radiating back pain  Radiating back pain: Symptoms completely resolved s/p prednisone dose pack.  No further pain, numbness, paresthesia.  States he is back to baseline.  Habits & Providers  Alcohol-Tobacco-Diet     Tobacco Status: current     Tobacco Counseling: to quit use of tobacco products     Cigarette Packs/Day: <0.25  Current Medications (verified): 1)  Lisinopril 5 Mg Tabs (Lisinopril) .Marland Kitchen.. 1 Tab By Mouth Daily 2)  Paxil 20 Mg Tabs (Paroxetine Hcl) .Marland Kitchen.. 1 Tab By Mouth At Bedtime  Allergies (verified): No Known Drug Allergies  Social History: Smoking Status:  current Packs/Day:  <0.25  Review of Systems      See HPI  Physical Exam  General:  VS Reviewed. Well appearing, NAD.  Msk:  No ttp of entire spine Neg straight leg raise sitting Full ROM of lower back Neurologic:  5/5 strength LE 2+ dtrs sensation grossly intact nl gait   Impression & Recommendations:  Problem # 1:  BACK PAIN, LUMBAR, WITH RADICULOPATHY (ICD-724.4) Assessment Improved  Symptoms completely resolved s/p prednisone dose pack. He was not aware of his MRI appt. No further imaging unless symptoms return. No further PT required.  f/u as needed   The following medications were removed from the medication list:    Ultram 50 Mg Tabs (Tramadol hcl) .Marland Kitchen... 1 tab q6h as needed for pain    Tylenol Extra Strength 500 Mg Tabs (Acetaminophen) .Marland Kitchen... 2 tabs by mouth q8h scheduled  Orders: [facility]- Est Level  3  ([address])  Complete Medication List: 1)  Lisinopril 5 Mg Tabs (Lisinopril) .Marland Kitchen.. 1 tab by mouth daily 2)  Paxil 20 Mg Tabs (Paroxetine hcl) .Marland Kitchen.. 1 tab by mouth at bedtime

## 2010-10-13 NOTE — Miscellaneous (Signed)
Summary: ROI  ROI   Imported By: Clydell Hakim 11/07/2009 13:27:25  _____________________________________________________________________  External Attachment:    Type:   Image     Comment:   External Document

## 2010-10-13 NOTE — Letter (Signed)
Summary: Eddie Malone: Income Verification  Eddie Malone: Income Verification   Imported By: Florinda Marker 12/03/2009 09:58:52  _____________________________________________________________________  External Attachment:    Type:   Image     Comment:   External Document

## 2010-10-13 NOTE — Progress Notes (Signed)
Summary: [facility]  Phone Note Other Incoming   Caller: [facility]  Reason for Call: Get patient information Summary of Call: Selena Batten at [facility] [phone] called requesting updated phone info. for pt.  Pts. phone is out of service, this is the only number we have also.  Meds will not be delivered until they can contact pt. Initial call taken by: Wendall Mola CMA Duncan Dull),  May 21, 2010 2:35 PM

## 2010-10-13 NOTE — Assessment & Plan Note (Signed)
Summary: 45yo M new pt- HIV positive   Vital Signs:  Patient profile:   45 year old male Height:      68.25 inches Weight:      159.5 pounds BMI:     24.16 Temp:     98.1 degrees F oral Pulse rate:   50 / minute BP sitting:   134 / 75  (left arm) Cuff size:   regular  Vitals Entered By: Gladstone Pih (November 04, 2009 3:45 PM) CC: NP---get establisheed Is Patient Diabetic? No Pain Assessment Patient in pain? no        Primary Care Provider:  Marisue Ivan  MD  CC:  NP---get establisheed.  History of Present Illness: 45yo M new patient  Concerns: none  Recently discharged from the hospital at [facility] in [place] where he was dx with HIV and found to be positive for Syphilis.  He has moved to [place] to live with his cousin and now is looking to establish care.  He is in the process of obtaining Medicaid.  He states that he feels well and his last CD4 count was 707 (09/26/09).  He has recently completed treatment for syphillis.  HTN: Currently on Lisinopril 5mg  daily.: No adverse effects from medication.  Not checking it regularly.   No dizziness, HA, CP, palpitations, or swelling.  Depression: Not currently being treated.  Would like a medication.  No SI/HI.   Habits & Providers  Alcohol-Tobacco-Diet     Tobacco Status: never  Current Medications (verified): 1)  Lisinopril 5 Mg Tabs (Lisinopril) .Marland Kitchen.. 1 Tab By Mouth Daily 2)  Celexa 20 Mg Tabs (Citalopram Hydrobromide) .Marland Kitchen.. 1 Tablet By Mouth Daily  Allergies (verified): No Known Drug Allergies  Past History:  Past Medical History: HIV- dx 09/2009(CD4 1200 - 09/2009) HTN- dx 09/2009 Hx of Syphillis Depression Sickle Cell???  Past Surgical History: None  Family History: Father- deceased @89 ; HTN questionable hx of CAD Mother- CVAs, Sickle Cell trait Siblings- HTN, DM, no CAD  Social History: Lives in [place] with cousin Ellene Route. Unemployed Hx of homosexuality No Tobacco (hx of 3pack yr hx - quit  in 09/2009) No EtOH No drugs Walks for exercise Best contact # [phone]- James Ivanoff (cousin) cell,  [phone] homeSmoking Status:  never  Review of Systems       No dizziness, HA, CP, palpitations, or swelling.   Physical Exam  General:  VS Reviewed. Well appearing, NAD.  Eyes:  EOMI PERRLA nl gross vision no injected conjunctiva Mouth:  moist mucus membranes no lesions mild dental decay Neck:  supple, full ROM, no goiter or mass  Lungs:  Normal respiratory effort, chest expands symmetrically. Lungs are clear to auscultation, no crackles or wheezes. Heart:  Normal rate and regular rhythm. S1 and S2 normal without gallop, murmur, click, rub or other extra sounds. Abdomen:  Soft, NT, ND, no HSM, active BS  Msk:  no joint effusion or erythema moves all ext Extremities:  no edema Neurologic:  no focal deficits Skin:  hyperpigmented macule lesion in a symmetric pattern covering the entire back; possible herald patch on lower right flank Cervical Nodes:  enlarged nontender submandibular  Axillary Nodes:  R enlg but nontender Psych:  no SI/HI   Impression & Recommendations:  Problem # 1:  HIV INFECTION (ICD-042) Assessment Unchanged Dx 09/2009. Last CD4 count 707 (09/26/09) Not currently on any HIV medications. Plan to refer to ID clinic for evaluation and treatment.  Problem # 2:  Hx of SYPHILIS (ICD-097.9) Assessment:  Improved s/p treatment w/ PCN.   Hyperpigmentation on his back could be possibly due to secondary syphillis rash.  Problem # 3:  DEPRESSION (ICD-311) Assessment: Unchanged No SI/HI. Plan to start Celexa 20mg  daily. Will f/u in 2 weeks to reassess.  His updated medication list for this problem includes:    Celexa 20 Mg Tabs (Citalopram hydrobromide) .Marland Kitchen... 1 tablet by mouth daily  Problem # 4:  ESSENTIAL HYPERTENSION, BENIGN (ICD-401.1) Assessment: Unchanged At goal (<140/90). Continue on Lisinopril 5mg  once daily. Renal function stable (Cr  1.3)  His updated medication list for this problem includes:    Lisinopril 5 Mg Tabs (Lisinopril) .Marland Kitchen... 1 tab by mouth daily  Problem # 5:  Preventive Health Care (ICD-V70.0) Assessment: Comment Only Pt up to date on vaccinations. Will obtain records from [facility] in [place]. Will need to discuss prostate screening in the future given his risk factors.  Complete Medication List: 1)  Lisinopril 5 Mg Tabs (Lisinopril) .Marland Kitchen.. 1 tab by mouth daily 2)  Celexa 20 Mg Tabs (Citalopram hydrobromide) .Marland Kitchen.. 1 tablet by mouth daily  Other Orders: Infectious Disease Referral (ID)  Patient Instructions: 1)  Please schedule a follow-up appointment in 2 weeks to reassess depression and response to Celexa. 2)  We will contact you regarding getting you plugged into the HIV clinic. Prescriptions: CELEXA 20 MG TABS (CITALOPRAM HYDROBROMIDE) 1 tablet by mouth daily  #30 x 1   Entered and Authorized by:   Marisue Ivan  MD   Signed by:   Marisue Ivan  MD on 11/04/2009   Method used:   Electronically to        [facility] [phone]* (retail)       [address]       [place], [place]  [address]       Ph: [phone]       Fax: [phone]   RxID:   [id number] LISINOPRIL 5 MG TABS (LISINOPRIL) 1 tab by mouth daily  #90 x 0   Entered and Authorized by:   Marisue Ivan  MD   Signed by:   Marisue Ivan  MD on 11/04/2009   Method used:   Electronically to        [facility] [phone]* (retail)       [address]       [place], [place]  [address]       Ph: [phone]       Fax: [phone]   RxID:   [id number]    Prevention & Chronic Care Immunizations   Influenza vaccine: Not documented   Influenza vaccine deferral: Not indicated  (11/04/2009)    Tetanus booster: Not documented    Pneumococcal vaccine: Not documented  Other Screening   Smoking status: never  (11/04/2009)  Lipids   Total Cholesterol: 182  (09/22/2009)   LDL: 141  (09/22/2009)    LDL Direct: Not documented   HDL: 18  (09/22/2009)   Triglycerides: 115  (09/22/2009)  Hypertension   Last Blood Pressure: 134 / 75  (11/04/2009)   Serum creatinine: 1.3  (10/05/2009)   Serum potassium 3.9  (10/05/2009)    Hypertension flowsheet reviewed?: Yes   Progress toward BP goal: At goal  Self-Management Support :   Personal Goals (by the next clinic visit) :      Personal blood pressure goal: 140/90  (11/04/2009)   Patient will work on the following items until the next clinic visit to reach self-care goals:     Medications  and monitoring: take my medicines every day, check my blood pressure, bring all of my medications to every visit  (11/04/2009)     Eating: drink diet soda or water instead of juice or soda, eat more vegetables, use fresh or frozen vegetables, eat foods that are low in salt, eat baked foods instead of fried foods, eat fruit for snacks and desserts  (11/04/2009)    Hypertension self-management support: BP self-monitoring log, Written self-care plan, Education handout  (11/04/2009)   Hypertension self-care plan printed.   Hypertension education handout printed

## 2010-10-13 NOTE — Miscellaneous (Signed)
Summary: clinical update/ryan white ncadap apprv til 12/12/10 RX sent  Clinical Lists Changes  Medications: Rx of ATRIPLA 600-200-300 MG TABS (EFAVIRENZ-EMTRICITAB-TENOFOVIR) Take 1 tablet by mouth at bedtime;  #30 x 5;  Signed;  Entered by: Paulo Fruit  BS,CPht II,MPH;  Authorized by: Acey Lav MD;  Method used: [facility] [phone]*, [address], [place], [place]  [address], Ph: [phone], Fax: Observations: Added new observation of AIDSDAP: Yes 2011 (05/15/2010 9:58)    Prescriptions: ATRIPLA 600-200-300 MG TABS (EFAVIRENZ-EMTRICITAB-TENOFOVIR) Take 1 tablet by mouth at bedtime  #30 x 5   Entered by:   Paulo Fruit  BS,CPht II,MPH   Authorized by:   Acey Lav MD   Signed by:   Paulo Fruit  BS,CPht II,MPH on 05/15/2010   Method used:   Electronically to        [facility] [phone]* (retail)       [address]       [place], [place]  [address]       Ph: [phone]       Fax:    RxID:   [id number]

## 2010-10-13 NOTE — Assessment & Plan Note (Signed)
Summary: new pt labs 3/14   Primary Provider:  Marisue Ivan  MD  CC:  new patient.  History of Present Illness: This is the first ID clinic vist for Eddie Malone.  He was diagnosed HIV (+) a few months ago when he was hospitalized for pancreatitis at [facility]. He was diagnosed with syphilis at the same time and treated with PCN. He had a negative HIV test within the last year. He currently c/o back pain. No fever, night sweats or diarrhea.  He recently moved here to live with his cousin.  Preventive Screening-Counseling & Management  Alcohol-Tobacco     Alcohol drinks/day: 0     Smoking Status: never  Caffeine-Diet-Exercise     Caffeine use/day: occasional soda     Does Patient Exercise: yes     Type of exercise: walking   Updated Prior Medication List: LISINOPRIL 5 MG TABS (LISINOPRIL) 1 tab by mouth daily PAXIL 20 MG TABS (PAROXETINE HCL) 1 tab by mouth at bedtime  Current Allergies (reviewed today): No known allergies  Past History:  Past Medical History: Last updated: 11/04/2009 HIV- dx 09/2009(CD4 1200 - 09/2009) HTN- dx 09/2009 Hx of Syphillis Depression Sickle Cell???  Review of Systems  The patient denies anorexia, fever, and weight loss.    Vital Signs:  Patient profile:   45 year old male Height:      68.25 inches (173.35 cm) Weight:      165.7 pounds (75.32 kg) BMI:     25.10 Temp:     97.6 degrees F (36.44 degrees C) oral Pulse rate:   66 / minute BP sitting:   130 / 74  (left arm)  Vitals Entered By: Wendall Mola CMA Duncan Dull) (December 08, 2009 2:17 PM) CC: new patient Is Patient Diabetic? No Pain Assessment Patient in pain? yes     Location: back Intensity: 10 Type: sharp Onset of pain  Constant Nutritional Status BMI of 25 - 29 = overweight Nutritional Status Detail appetie "good"  Does patient need assistance? Functional Status Self care Ambulation Normal   Physical Exam  General:  alert, well-developed, well-nourished, and  well-hydrated.  alert, well-developed, well-nourished, and well-hydrated.   Head:  normocephalic and atraumatic.  normocephalic and atraumatic.   Mouth:  pharynx pink and moist.  pharynx pink and moist.   Lungs:  normal breath sounds.  normal breath sounds.             Prevention For Positives: 12/08/2009   Safe sex practices discussed with patient. Condoms offered.                             Impression & Recommendations:  Problem # 1:  HIV INFECTION (ICD-042) Discussed pathophysiology of HIV and the meaning of CD4ct and VL.  Pt.s current Cd4ct is  1090 and VL is  13,100. Genotype shows a niave virus.  At this Point antiretroviral treatment is not indicated .  Discussed safe sex and transmisiion routes with the patient.  Pt given a pneumovax and Influenza vaccine.  Diagnostics Reviewed:  HIV: REACTIVE (11/24/2009)   HIV-Western blot: Positive (11/24/2009)   CD4: 1090 (11/25/2009)   WBC: 5.6 (11/24/2009)   Hgb: 13.6 (11/24/2009)   HCT: 42.7 (11/24/2009)   Platelets: 201 (11/24/2009) HIV genotype: * (11/24/2009)   HIV-1 RNA: 13100 (11/24/2009)   HBSAg: NEG (11/24/2009)  Orders: New Patient Level III (99203)Future Orders: T-CD4SP ([facility]) (CD4SP) ... 03/08/2010 T-HIV Viral Load [phone]) ... 03/08/2010  T-Comprehensive Metabolic Panel ([phone]) ... 03/08/2010 T-CBC w/Diff ([id number]) ... 03/08/2010 T-RPR (Syphilis) ([phone]) ... 03/08/2010  Other Orders: Influenza Vaccine NON MCR (13244) Pneumococcal Vaccine (01027) Admin 1st Vaccine (25366)  Patient Instructions: 1)  Please schedule a follow-up appointment in 3 months, 2 weeks after labs.  Process Orders Check Orders Results:     [facility]: ABN not required for this insurance Tests Sent for requisitioning (December 08, 2009 3:56 PM):     03/08/2010: [facility] -- T-HIV Viral Load [phone] (signed)     03/08/2010: [facility] -- T-Comprehensive  Metabolic Panel [[id number]] (signed)     03/08/2010: [facility] -- T-CBC w/Diff [[id number]] (signed)     03/08/2010: [facility] -- T-RPR (Syphilis) [phone] (signed)    Immunizations Administered:  Influenza Vaccine # 1:    Vaccine Type: Fluvax Non-MCR    Site: left deltoid    Mfr: novartis    Dose: 0.5 ml    Route: IM    Given by: Wendall Mola CMA ( AAMA)    Exp. Date: 12/12/2009    Lot #: [id number]    VIS given: 04/06/07 version given December 08, 2009.  Pneumonia Vaccine:    Vaccine Type: Pneumovax    Site: right deltoid    Mfr: Merck    Dose: 0.5 ml    Route: IM    Given by: Wendall Mola CMA ( AAMA)    Exp. Date: 08/22/2010    Lot #: [id number]    VIS given: 04/10/96 version given December 08, 2009.  Flu Vaccine Consent Questions:    Do you have a history of severe allergic reactions to this vaccine? no    Any prior history of allergic reactions to egg and/or gelatin? no    Do you have a sensitivity to the preservative Thimersol? no    Do you have a past history of Guillan-Barre Syndrome? no    Do you currently have an acute febrile illness? no    Have you ever had a severe reaction to latex? no    Vaccine information given and explained to patient? yes

## 2010-10-13 NOTE — Miscellaneous (Signed)
Summary: PT not helping  Clinical Lists Changes [name] from PT on [address] [phone] states he has had 4 treatments. his back pain is worse. asked if he needed to be seen by md again. I called pt & he stated he will be here before 3:30.Golden Circle RN  January 07, 2010 3:27 PM

## 2010-10-13 NOTE — Miscellaneous (Signed)
Summary: Orders Update  Clinical Lists Changes  Orders: Added new Test order of T-CBC w/Diff [phone]) - Signed Added new Test order of T-CD4SP [facility]) (CD4SP) - Signed Added new Test order of T-Chlamydia  Probe, urine [phone]) - Signed Added new Test order of T-GC Probe, urine [phone]) - Signed Added new Test order of T-Comprehensive Metabolic Panel ([id number]) - Signed Added new Test order of T-HIV Antibody  (Reflex) [phone]) - Signed Added new Test order of T-Hepatitis B Surface Antigen [phone]) - Signed Added new Test order of T-Hepatitis B Surface Antibody ([id number]) - Signed Added new Test order of T-Hepatitis B Core Antibody ([id number]) - Signed Added new Test order of T-Hepatitis C Antibody ([id number]) - Signed Added new Test order of T-Hepatitis A Antibody ([id number]) - Signed Added new Test order of T-HIV Viral Load ([id number]) - Signed Added new Test order of T-HIV Ab Confirmatory Test/Western Blot ([id number]) - Signed Added new Test order of T-RPR (Syphilis) ([id number]) - Signed     Process Orders Check Orders Results:     [facility]: ABN not required for this insurance Order queued for requisitioning for [facility]: November 21, 2009 5:05 PM  Tests Sent for requisitioning (November 21, 2009 5:05 PM):     11/24/2009: [facility] -- T-CBC w/Diff [[id number]] (signed)     11/24/2009: [facility] -- T-Chlamydia  Probe, urine [phone] (signed)     11/24/2009: [facility] -- T-GC Probe, urine [phone] (signed)     11/24/2009: [facility] -- T-Comprehensive Metabolic Panel [[id number]] (signed)     11/24/2009: [facility] -- T-HIV Antibody  (Reflex) [[id number]] (signed)     11/24/2009: [facility] -- T-Hepatitis B Surface Antigen [[id number]] (signed)     11/24/2009: [facility] -- T-Hepatitis B  Surface Antibody [[id number]] (signed)     11/24/2009: [facility] -- T-Hepatitis B Core Antibody [[id number]] (signed)     11/24/2009: [facility] -- T-Hepatitis C Antibody [[id number]] (signed)     11/24/2009: [facility] -- T-Hepatitis A Antibody [[id number]] (signed)     11/24/2009: [facility] -- T-HIV Viral Load [phone] (signed)     11/24/2009: [facility] -- T-HIV Ab Confirmatory Test/Western Blot [phone] (signed)     11/24/2009: [facility] -- T-RPR (Syphilis) [phone] (signed)

## 2010-10-13 NOTE — Assessment & Plan Note (Signed)
Summary: worsened lower back pain w/ radiculopathy   Vital Signs:  Patient profile:   45 year old male Height:      68.25 inches Weight:      170 pounds BMI:     25.75 Temp:     98.2 degrees F oral Pulse rate:   69 / minute BP sitting:   133 / 86  (right arm) Cuff size:   regular  Vitals Entered By: Tessie Fass CMA (January 07, 2010 3:54 PM) CC: lower back pain Is Patient Diabetic? No Pain Assessment Patient in pain? yes     Location: lower back Intensity: 10   Primary Care Provider:  Marisue Ivan  MD  CC:  lower back pain.  History of Present Illness: 45yo M w/ worsened lower back pain  Lower back pain: Worsened x 2 days s/p traction meneuver at PT.  Reports sharp radiating pain (10/10) down the left lateral leg to the knee that occurs with sitting, lying, walking, and standing.  Also reports weakness and decrease sensation of the lower extremity.  No bowel or bladder incontinence.    Habits & Providers  Alcohol-Tobacco-Diet     Tobacco Status: never  Current Medications (verified): 1)  Lisinopril 5 Mg Tabs (Lisinopril) .Marland Kitchen.. 1 Tab By Mouth Daily 2)  Paxil 20 Mg Tabs (Paroxetine Hcl) .Marland Kitchen.. 1 Tab By Mouth At Bedtime 3)  Prednisone (Pak) 10 Mg Tabs (Prednisone) .... 5 Tabs On Day 1, Then 4, Then 3, Then 2, Then 1 Disp: Qs 4)  Ultram 50 Mg Tabs (Tramadol Hcl) .Marland Kitchen.. 1 Tab Q6h As Needed For Pain 5)  Tylenol Extra Strength 500 Mg Tabs (Acetaminophen) .... 2 Tabs By Mouth Q8h Scheduled  Allergies (verified): No Known Drug Allergies  Review of Systems      See HPI  Physical Exam  General:  VS Reviewed. Pt sitting on exam table in NAD except with movement.  Msk:  Back exam: no obvious deformities, ecchymosis, erythema, or effusion ROM- limited due to pain Positive straight leg raise- sitting Neurologic:  4/5 hip flexion on left 5/5 lower leg extension and flexion and foot dorsal/plantar flexion dec sensation on left Atalgic stiff gait   Impression &  Recommendations:  Problem # 1:  BACK PAIN, LUMBAR, WITH RADICULOPATHY (ICD-724.4) Assessment Deteriorated Worsened x 2 days s/p traction maneuver during physical therapy. Now with worsened radiculopathy symptoms. Concern for worsened herniated disc with nerve impingement. Plan to start on prednisone dose pack with schedule tylenol and ultram for breakthrough pain. Will set up for evaluation with MRI and f/u in 1 week.   The following medications were removed from the medication list:    Diclofenac Sodium 75 Mg Tbec (Diclofenac sodium) .Marland Kitchen... 1 tablet by mouth two times a day for the next 2 weeks    Cyclobenzaprine Hcl 10 Mg Tabs (Cyclobenzaprine hcl) .Marland Kitchen... 1/2 -1 tab by mouth three times a day as needed for muscle spasm His updated medication list for this problem includes:    Ultram 50 Mg Tabs (Tramadol hcl) .Marland Kitchen... 1 tab q6h as needed for pain    Tylenol Extra Strength 500 Mg Tabs (Acetaminophen) .Marland Kitchen... 2 tabs by mouth q8h scheduled  Orders: MRI without Contrast (MRI w/o Contrast) [facility]- Est  Level 4 (09811)  Complete Medication List: 1)  Lisinopril 5 Mg Tabs (Lisinopril) .Marland Kitchen.. 1 tab by mouth daily 2)  Paxil 20 Mg Tabs (Paroxetine hcl) .Marland Kitchen.. 1 tab by mouth at bedtime 3)  Prednisone (pak) 10 Mg Tabs (Prednisone) .... 5  tabs on day 1, then 4, then 3, then 2, then 1 disp: qs 4)  Ultram 50 Mg Tabs (Tramadol hcl) .Marland Kitchen.. 1 tab q6h as needed for pain 5)  Tylenol Extra Strength 500 Mg Tabs (Acetaminophen) .... 2 tabs by mouth q8h scheduled  Patient Instructions: 1)  Schedule f/u appt on May 4th with Dr. Burnadette Pop. 2)  Ok to double book. 3)  I put you on a steroid pack, tylenol, and ultram.   4)  Go to the hospital if you're pain is not controlled.   Prescriptions: ULTRAM 50 MG TABS (TRAMADOL HCL) 1 tab q6h as needed for pain  #40 x 0   Entered and Authorized by:   Marisue Ivan  MD   Signed by:   Marisue Ivan  MD on 01/07/2010   Method used:   Electronically to        [facility] [phone]* (retail)       [address]       [place], [place]  [address]       Ph: [phone]       Fax: [phone]   RxID:   [id number] PREDNISONE (PAK) 10 MG TABS (PREDNISONE) 5 tabs on day 1, then 4, then 3, then 2, then 1 disp: QS  #1 x 0   Entered and Authorized by:   Marisue Ivan  MD   Signed by:   Marisue Ivan  MD on 01/07/2010   Method used:   Electronically to        [facility] [phone]* (retail)       [address]       [place], [place]  [address]       Ph: [phone]       Fax: [phone]   RxID:   [id number]

## 2010-10-13 NOTE — Miscellaneous (Signed)
Summary: [facility]  [facility]   Imported By: Florinda Marker 07/22/2010 11:10:32  _____________________________________________________________________  External Attachment:    Type:   Image     Comment:   External Document

## 2010-10-13 NOTE — Miscellaneous (Signed)
Summary: [facility] MRI  Clinical Lists Changes rec'd message from radiology. he [facility] his mri today.Golden Circle RN  Jan 13, 2010 11:48 AM

## 2010-10-13 NOTE — Assessment & Plan Note (Signed)
Summary: F/U/VS   Primary Regina Coppolino:  Marisue Ivan  MD  CC:  follow-up visit and lab results.  History of Present Illness: Pt has just gotten approved for ADAP so has not started his Atripla yet.  No new problems.  Depression History:      The patient denies a depressed mood most of the day and a diminished interest in his usual daily activities.        The patient denies that he feels like life is not worth living, denies that he wishes that he were dead, and denies that he has thought about ending his life.        Preventive Screening-Counseling & Management  Alcohol-Tobacco     Alcohol drinks/day: 0     Smoking Status: current     Smoking Cessation Counseling: yes     Packs/Day: <0.25     Tobacco Counseling: to quit use of tobacco products  Caffeine-Diet-Exercise     Caffeine use/day: yes     Does Patient Exercise: yes     Type of exercise: walking     Exercise (avg: min/session): >60     Times/week: 5  Hep-HIV-STD-Contraception     HIV Risk: no risk noted  Safety-Violence-Falls     Seat Belt Use: yes      Sexual History:  currently monogamous.        Drug Use:  never.    Comments: pt. given condoms   Updated Prior Medication List: LISINOPRIL 5 MG TABS (LISINOPRIL) 1 tab by mouth daily PAXIL 20 MG TABS (PAROXETINE HCL) 1 tab by mouth at bedtime ATRIPLA 600-200-300 MG TABS (EFAVIRENZ-EMTRICITAB-TENOFOVIR) Take 1 tablet by mouth at bedtime  Current Allergies (reviewed today): No known allergies  Past History:  Past Medical History: Last updated: 11/04/2009 HIV- dx 09/2009(CD4 1200 - 09/2009) HTN- dx 09/2009 Hx of Syphillis Depression Sickle Cell???  Review of Systems  The patient denies anorexia, fever, and weight loss.    Vital Signs:  Patient profile:   45 year old male Height:      68.25 inches (173.35 cm) Weight:      169.4 pounds (77 kg) BMI:     25.66 Temp:     98.0 degrees F (36.67 degrees C) oral Pulse rate:   56 / minute BP  sitting:   129 / 77  (left arm)  Vitals Entered By: Wendall Mola CMA Duncan Dull) (May 11, 2010 9:54 AM) CC: follow-up visit, lab results Is Patient Diabetic? No Pain Assessment Patient in pain? yes     Location: lower back Intensity: 3 Type: aching Onset of pain  Intermittent Nutritional Status BMI of 25 - 29 = overweight Nutritional Status Detail appetite "good"  Does patient need assistance? Functional Status Self care Ambulation Normal Comments no missed doses of meds per patient   Physical Exam  General:  alert, well-developed, well-nourished, and well-hydrated.   Head:  normocephalic and atraumatic.   Mouth:  pharynx pink and moist.   Lungs:  normal breath sounds.      Impression & Recommendations:  Problem # 1:  HIV INFECTION (ICD-042) Pt will meet with Byrd Hesselbach to get started on HIV meds.  Potential side effects were discussed.  Pt to return in 6 weeks for repeat labs. Diagnostics Reviewed:  HIV: HIV positive - not AIDS (12/08/2009)   HIV-Western blot: Positive (11/24/2009)   CD4: 930 (04/28/2010)   WBC: 5.2 (04/27/2010)   Hgb: 12.9 (04/27/2010)   HCT: 41.3 (04/27/2010)   Platelets: 183 (04/27/2010)  HIV genotype: * (11/24/2009)   HIV-1 RNA: 9440 (04/27/2010)   HBSAg: NEG (11/24/2009)  Orders: Est. Patient Level III (99213)Future Orders: T-CD4SP ([facility]) (CD4SP) ... 06/22/2010 T-HIV Viral Load [id number]) ... 06/22/2010 T-Comprehensive Metabolic Panel [phone]) ... 06/22/2010 T-CBC w/Diff ([id number]) ... 06/22/2010  Patient Instructions: 1)  Please schedule a follow-up appointment in 8 weeks, 2 weeks after labs.

## 2010-10-13 NOTE — Assessment & Plan Note (Signed)
Summary: back pain,tcb   Vital Signs:  Patient profile:   45 year old male Weight:      164.7 pounds Temp:     98.2 degrees F oral Pulse rate:   71 / minute Pulse rhythm:   regular BP sitting:   122 / 79  (left arm) Cuff size:   regular  Vitals Entered By: Loralee Pacas CMA (December 11, 2009 9:42 AM)  Primary Care Provider:  Marisue Ivan  MD  CC:  back pain.  History of Present Illness: 1.  back pain--has a herniated disc.  diagnosed with that in 2002 when injured on the job.  thinks it is L5.  got worse about 2 weeks ago after he lifted a table.  went to urgent care.  did x-ray, which showed only mild scoliosis.  given flexeri, vicodin, diclofenac, but only filled vicodin.  vicodin not helping.   worse with walking, but hurting all of the time.  nothing relieving it.  hurts in lower center back.  radiates to left anterior thigh.  feels like that leg gets weak and numb.  has tingling in his toes.  no groin anesthesia.  no hx of cancer.  no bowel or bladder incontinence.  reviewed mri in echart from 2003, read is as follows:    IMPRESSION:  MODERATE CENTRAL HNP AT L5-S1 ABUTTING THE   ANTERIOR THECAL SAC AND THE VERY PROXIMAL ASPECTS OF THE   S1  NERVE ROOTS BILATERALLY.   NO EVIDENCE OF FRACTURE, SUBLUXATION, DISLOCATION.   MILD, BROAD BASED, DISK BULGES AT L3-4, L4-5, WITHOUT   IMPINGEMENT.   Current Medications (verified): 1)  Lisinopril 5 Mg Tabs (Lisinopril) .Marland Kitchen.. 1 Tab By Mouth Daily 2)  Paxil 20 Mg Tabs (Paroxetine Hcl) .Marland Kitchen.. 1 Tab By Mouth At Bedtime 3)  Diclofenac Sodium 75 Mg Tbec (Diclofenac Sodium) .Marland Kitchen.. 1 Tablet By Mouth Two Times A Day For The Next 2 Weeks 4)  Cyclobenzaprine Hcl 10 Mg Tabs (Cyclobenzaprine Hcl) .... 1/2 -1 Tab By Mouth Three Times A Day As Needed For Muscle Spasm  Allergies: No Known Drug Allergies  Past History:  Past Medical History: Reviewed history from 11/04/2009 and no changes required. HIV- dx 09/2009(CD4 1200 - 09/2009) HTN- dx  09/2009 Hx of Syphillis Depression Sickle Cell???  Review of Systems General:  Denies fever, loss of appetite, and weight loss. GU:  Denies dysuria and incontinence. MS:  Complains of loss of strength, low back pain, and stiffness. Neuro:  Denies brief paralysis, falling down, and numbness.  Physical Exam  General:  thin, alert, obviously uncomfortable Msk:  lower back:  very stiff gait.  can heel and toe walk.  ttp over l-spine and paraspinous muscles.  no ttp over sciatic notch or piriformis or SI joint.  no bony deformities.  cannot test ROM adequately b/c he is too much pain.  cannot elicit reflexes, but pt not able to relax adequately.  5/5 strength in major muscle groups of lower extremities.  +straight leg raise bilaterally at about 30 degrees.  sensation grossly intact.      Impression & Recommendations:  Problem # 1:  BACK PAIN, LUMBAR, WITH RADICULOPATHY (ICD-724.4) Assessment New +radicular symptoms, but no neurological deficits or red flags.  will treat conservatively with diclofenac and flexeril.  refer for physical therapy.  follow up in 7-10 days.  if not some improvment, will need to consider mri.  this will be tricky because he has partnership insurance.    His updated medication list for this  problem includes:    Diclofenac Sodium 75 Mg Tbec (Diclofenac sodium) .Marland Kitchen... 1 tablet by mouth two times a day for the next 2 weeks    Cyclobenzaprine Hcl 10 Mg Tabs (Cyclobenzaprine hcl) .Marland Kitchen... 1/2 -1 tab by mouth three times a day as needed for muscle spasm  Orders: [facility]- Est  Level 4 (16109)  Complete Medication List: 1)  Lisinopril 5 Mg Tabs (Lisinopril) .Marland Kitchen.. 1 tab by mouth daily 2)  Paxil 20 Mg Tabs (Paroxetine hcl) .Marland Kitchen.. 1 tab by mouth at bedtime 3)  Diclofenac Sodium 75 Mg Tbec (Diclofenac sodium) .Marland Kitchen.. 1 tablet by mouth two times a day for the next 2 weeks 4)  Cyclobenzaprine Hcl 10 Mg Tabs (Cyclobenzaprine hcl) .... 1/2 -1 tab by mouth three times a day as needed for  muscle spasm  Other Orders: Physical Therapy Referral (PT)  Patient Instructions: 1)  It was nice to see you today. 2)  For your back, take the cyclobenzaprine (muscle relaxer) as needed. 3)  Take the diclofenac (helps with pain and inflammation) every day for the next two weeks two times a day).   4)  If your groin goes numb or you lose control of your bowel or bladder or if your leg gets weak, go to the ER or come to the office immediately. 5)  Please schedule a follow-up appointment in 7-10 days with me or with Dr. Elbert Ewings. Prescriptions: CYCLOBENZAPRINE HCL 10 MG TABS (CYCLOBENZAPRINE HCL) 1/2 -1 tab by mouth three times a day as needed for muscle spasm  #30 x 0   Entered and Authorized by:   Asher Muir MD   Signed by:   Asher Muir MD on 12/11/2009   Method used:   Electronically to        [facility] [phone]* (retail)       [address]       [place], [place]  [address]       Ph: [phone]       Fax: [phone]   RxID:   [id number] DICLOFENAC SODIUM 75 MG TBEC (DICLOFENAC SODIUM) 1 tablet by mouth two times a day for the next 2 weeks  #60 x 0   Entered and Authorized by:   Asher Muir MD   Signed by:   Asher Muir MD on 12/11/2009   Method used:   Electronically to        [facility] [phone]* (retail)       [address]       [place], [place]  [address]       Ph: [phone]       Fax: [phone]   RxID:   [id number]

## 2010-10-13 NOTE — Consult Note (Signed)
Summary: [facility]  [facility]   Imported By: Clydell Hakim 02/20/2010 16:10:37  _____________________________________________________________________  External Attachment:    Type:   Image     Comment:   External Document  Appended Document: [facility] Reviewed.

## 2010-10-14 ENCOUNTER — Encounter: Payer: Self-pay | Admitting: *Deleted

## 2010-11-04 ENCOUNTER — Encounter (INDEPENDENT_AMBULATORY_CARE_PROVIDER_SITE_OTHER): Payer: Self-pay | Admitting: *Deleted

## 2010-11-10 NOTE — Miscellaneous (Signed)
  Clinical Lists Changes  Observations: Added new observation of HIV RISK BEH: MSM (11/04/2010 17:08)

## 2010-11-26 LAB — T-HELPER CELL (CD4) - (RCID CLINIC ONLY): CD4 T Cell Abs: 760 uL (ref 400–2700)

## 2010-11-27 LAB — T-HELPER CELL (CD4) - (RCID CLINIC ONLY): CD4 % Helper T Cell: 41 % (ref 33–55)

## 2010-11-29 LAB — T-HELPER CELL (CD4) - (RCID CLINIC ONLY)
CD4 % Helper T Cell: 43 % (ref 33–55)
CD4 T Cell Abs: 700 uL (ref 400–2700)

## 2010-12-07 LAB — POCT URINALYSIS DIP (DEVICE)
Glucose, UA: NEGATIVE mg/dL
Nitrite: NEGATIVE
Protein, ur: NEGATIVE mg/dL
Urobilinogen, UA: 0.2 mg/dL (ref 0.0–1.0)

## 2010-12-07 LAB — T-HELPER CELL (CD4) - (RCID CLINIC ONLY)
CD4 % Helper T Cell: 42 % (ref 33–55)
CD4 T Cell Abs: 1090 uL (ref 400–2700)

## 2014-08-30 ENCOUNTER — Emergency Department: Payer: Self-pay | Admitting: Emergency Medicine

## 2014-08-31 LAB — GC/CHLAMYDIA PROBE AMP

## 2015-04-04 ENCOUNTER — Encounter (HOSPITAL_BASED_OUTPATIENT_CLINIC_OR_DEPARTMENT_OTHER): Payer: Self-pay

## 2015-04-04 ENCOUNTER — Emergency Department (HOSPITAL_BASED_OUTPATIENT_CLINIC_OR_DEPARTMENT_OTHER)
Admission: EM | Admit: 2015-04-04 | Discharge: 2015-04-04 | Disposition: A | Discharge status: Home / Self Care | Payer: Medicaid Other | Attending: Physician Assistant | Admitting: Physician Assistant

## 2015-04-04 DIAGNOSIS — I10 Essential (primary) hypertension: Secondary | ICD-10-CM | POA: Diagnosis not present

## 2015-04-04 DIAGNOSIS — Z21 Asymptomatic human immunodeficiency virus [HIV] infection status: Secondary | ICD-10-CM | POA: Insufficient documentation

## 2015-04-04 DIAGNOSIS — S00531A Contusion of lip, initial encounter: Secondary | ICD-10-CM | POA: Insufficient documentation

## 2015-04-04 DIAGNOSIS — Z720 Tobacco use: Secondary | ICD-10-CM | POA: Diagnosis not present

## 2015-04-04 DIAGNOSIS — W500XXA Accidental hit or strike by another person, initial encounter: Secondary | ICD-10-CM | POA: Insufficient documentation

## 2015-04-04 DIAGNOSIS — Y929 Unspecified place or not applicable: Secondary | ICD-10-CM | POA: Diagnosis not present

## 2015-04-04 DIAGNOSIS — Y9383 Activity, rough housing and horseplay: Secondary | ICD-10-CM | POA: Insufficient documentation

## 2015-04-04 DIAGNOSIS — S0993XA Unspecified injury of face, initial encounter: Secondary | ICD-10-CM | POA: Diagnosis present

## 2015-04-04 DIAGNOSIS — Z79899 Other long term (current) drug therapy: Secondary | ICD-10-CM | POA: Insufficient documentation

## 2015-04-04 DIAGNOSIS — K08109 Complete loss of teeth, unspecified cause, unspecified class: Secondary | ICD-10-CM

## 2015-04-04 DIAGNOSIS — Y998 Other external cause status: Secondary | ICD-10-CM | POA: Diagnosis not present

## 2015-04-04 HISTORY — DX: Essential (primary) hypertension: I10

## 2015-04-04 HISTORY — DX: Human immunodeficiency virus (HIV) disease: B20

## 2015-04-04 HISTORY — DX: Asymptomatic human immunodeficiency virus (hiv) infection status: Z21

## 2015-04-04 MED ORDER — OXYCODONE-ACETAMINOPHEN 5-325 MG PO TABS: 1.0000 | ORAL_TABLET | ORAL | Status: AC | PRN

## 2015-04-04 MED ORDER — OXYCODONE-ACETAMINOPHEN 5-325 MG PO TABS
1.0000 | ORAL_TABLET | Freq: Once | ORAL | Status: AC
  Administered 2015-04-04: 1 via ORAL
  Filled 2015-04-04: qty 1

## 2015-04-04 NOTE — Discharge Instructions (Signed)
Tooth Loss °Another word for a tooth getting knocked out is avulsion. When a tooth is knocked out, treatment includes controlling bleeding and pain. Permanent teeth may be successfully reimplanted in some cases. The sooner the tooth is cleaned and replaced in the proper position in the socket, the better the chance it can be saved. Evaluation by a dentist as soon as possible is needed so that the tooth may be positioned and stabilized. A temporary splint may be used to hold the tooth in place for the first 3 to 4 weeks. A splint joins the weak tooth to a strong tooth to increase the strength of the weak tooth. Baby teeth that are knocked out do not need to be reimplanted. °Reimplantation can be helped with emergency care if the entire tooth has been knocked out. This type of repair can be done only if the tooth can be reinserted within 2 hours of the accident. It is best if it can be done within a few minutes of the accident. A dentist should be seen as soon as possible. After 2 hours, the chances of saving the tooth are minimal. However, dental referral can be beneficial. Your dentist will discuss your options with you.  °STEPS TO TAKE IF YOU LOSE A TOOTH °· Do not handle the tooth by the root. °· Wash the tooth off in clean water but not under a tap. Do not scrub the tooth. °· You may try to replace the tooth in the socket. Gently bite down on it to get it in place. °· If trying to replace the tooth does not work, keep the tooth in a glass of milk or water. You may keep it in your own mouth if there is no danger of swallowing it. However, this is not recommended for children. °HOME CARE INSTRUCTIONS  °· You can use ice packs along your jaw to help control swelling and pain. °· For the next few days, eat a liquid or soft diet and rinse your mouth out with warm water after meals. °· Watch for signs of infection. °· You should take all medications for pain and antibiotics as prescribed by your dentist. °· An avulsed  tooth will require stabilization for 1 to 2 months to ensure proper healing. This means it should not move around. °SEEK MEDICAL CARE IF: °· Pain is becoming worse or uncontrollable with medication. °· You have increased swelling in your face or around the reimplanted tooth. °· You have an oral temperature above 102°F (38.9°C) not controlled by medication. °· You cannot open your mouth. °Document Released: 05/25/2001 Document Revised: 01/14/2014 Document Reviewed: 12/29/2009 °ExitCare® Patient Information ©2015 [employer]. This information is not intended to replace advice given to you by your health care provider. Make sure you discuss any questions you have with your health care provider. ° °
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
## 2015-05-09 ENCOUNTER — Inpatient Hospital Stay (HOSPITAL_COMMUNITY)
Admission: EM | Admit: 2015-05-09 | Discharge: 2015-05-10 | DRG: 388 | Disposition: A | Discharge status: Home / Self Care | Payer: Medicaid Other | Attending: Internal Medicine | Admitting: Internal Medicine

## 2015-05-09 ENCOUNTER — Encounter (HOSPITAL_COMMUNITY): Payer: Self-pay | Admitting: Emergency Medicine

## 2015-05-09 ENCOUNTER — Observation Stay (HOSPITAL_COMMUNITY): Payer: Medicaid Other

## 2015-05-09 DIAGNOSIS — N182 Chronic kidney disease, stage 2 (mild): Secondary | ICD-10-CM

## 2015-05-09 DIAGNOSIS — I10 Essential (primary) hypertension: Secondary | ICD-10-CM | POA: Diagnosis not present

## 2015-05-09 DIAGNOSIS — Z79899 Other long term (current) drug therapy: Secondary | ICD-10-CM | POA: Diagnosis not present

## 2015-05-09 DIAGNOSIS — R0789 Other chest pain: Secondary | ICD-10-CM

## 2015-05-09 DIAGNOSIS — Z9114 Patient's other noncompliance with medication regimen: Secondary | ICD-10-CM | POA: Diagnosis present

## 2015-05-09 DIAGNOSIS — F17210 Nicotine dependence, cigarettes, uncomplicated: Secondary | ICD-10-CM | POA: Diagnosis present

## 2015-05-09 DIAGNOSIS — K567 Ileus, unspecified: Principal | ICD-10-CM | POA: Diagnosis present

## 2015-05-09 DIAGNOSIS — R112 Nausea with vomiting, unspecified: Secondary | ICD-10-CM

## 2015-05-09 DIAGNOSIS — R079 Chest pain, unspecified: Secondary | ICD-10-CM | POA: Diagnosis not present

## 2015-05-09 DIAGNOSIS — B20 Human immunodeficiency virus [HIV] disease: Secondary | ICD-10-CM

## 2015-05-09 DIAGNOSIS — E785 Hyperlipidemia, unspecified: Secondary | ICD-10-CM | POA: Diagnosis present

## 2015-05-09 DIAGNOSIS — N183 Chronic kidney disease, stage 3 (moderate): Secondary | ICD-10-CM | POA: Diagnosis present

## 2015-05-09 DIAGNOSIS — F329 Major depressive disorder, single episode, unspecified: Secondary | ICD-10-CM | POA: Diagnosis present

## 2015-05-09 DIAGNOSIS — I129 Hypertensive chronic kidney disease with stage 1 through stage 4 chronic kidney disease, or unspecified chronic kidney disease: Secondary | ICD-10-CM | POA: Diagnosis present

## 2015-05-09 DIAGNOSIS — M549 Dorsalgia, unspecified: Secondary | ICD-10-CM | POA: Diagnosis present

## 2015-05-09 HISTORY — DX: Dorsalgia, unspecified: M54.9

## 2015-05-09 HISTORY — DX: Major depressive disorder, single episode, unspecified: F32.9

## 2015-05-09 HISTORY — DX: Syphilis, unspecified: A53.9

## 2015-05-09 HISTORY — DX: Depression, unspecified: F32.A

## 2015-05-09 HISTORY — DX: Acute pancreatitis without necrosis or infection, unspecified: K85.90

## 2015-05-09 HISTORY — DX: Chronic kidney disease, stage 2 (mild): N18.2

## 2015-05-09 LAB — LIPASE, BLOOD: LIPASE: 36 U/L (ref 22–51)

## 2015-05-09 LAB — COMPREHENSIVE METABOLIC PANEL
ALBUMIN: 3.9 g/dL (ref 3.5–5.0)
ALK PHOS: 39 U/L (ref 38–126)
ALT: 36 U/L (ref 17–63)
ANION GAP: 8 (ref 5–15)
AST: 35 U/L (ref 15–41)
BILIRUBIN TOTAL: 0.3 mg/dL (ref 0.3–1.2)
BUN: 12 mg/dL (ref 6–20)
CALCIUM: 8.9 mg/dL (ref 8.9–10.3)
CO2: 26 mmol/L (ref 22–32)
Chloride: 106 mmol/L (ref 101–111)
Creatinine, Ser: 1.38 mg/dL — ABNORMAL HIGH (ref 0.61–1.24)
GFR calc non Af Amer: 60 mL/min — ABNORMAL LOW (ref >60)
Glucose, Bld: 111 mg/dL — ABNORMAL HIGH (ref 65–99)
POTASSIUM: 3.6 mmol/L (ref 3.5–5.1)
SODIUM: 140 mmol/L (ref 135–145)
TOTAL PROTEIN: 7.4 g/dL (ref 6.5–8.1)

## 2015-05-09 LAB — RAPID URINE DRUG SCREEN, HOSP PERFORMED
Amphetamines: NOT DETECTED
BARBITURATES: NOT DETECTED
BENZODIAZEPINES: NOT DETECTED
COCAINE: NOT DETECTED
OPIATES: POSITIVE — AB
Tetrahydrocannabinol: POSITIVE — AB

## 2015-05-09 LAB — CBC WITH DIFFERENTIAL/PLATELET
BASOS ABS: 0 10*3/uL (ref 0.0–0.1)
BASOS PCT: 0 % (ref 0–1)
EOS ABS: 0.3 10*3/uL (ref 0.0–0.7)
EOS PCT: 5 % (ref 0–5)
HCT: 36.6 % — ABNORMAL LOW (ref 39.0–52.0)
HEMOGLOBIN: 12.4 g/dL — AB (ref 13.0–17.0)
Lymphocytes Relative: 33 % (ref 12–46)
Lymphs Abs: 2 10*3/uL (ref 0.7–4.0)
MCH: 31.2 pg (ref 26.0–34.0)
MCHC: 33.9 g/dL (ref 30.0–36.0)
MCV: 92.2 fL (ref 78.0–100.0)
Monocytes Absolute: 0.5 10*3/uL (ref 0.1–1.0)
Monocytes Relative: 8 % (ref 3–12)
NEUTROS PCT: 54 % (ref 43–77)
Neutro Abs: 3.4 10*3/uL (ref 1.7–7.7)
PLATELETS: 169 10*3/uL (ref 150–400)
RBC: 3.97 MIL/uL — AB (ref 4.22–5.81)
RDW: 11.9 % (ref 11.5–15.5)
WBC: 6.2 10*3/uL (ref 4.0–10.5)

## 2015-05-09 LAB — PROTIME-INR
INR: 1.09 (ref 0.00–1.49)
Prothrombin Time: 14.3 seconds (ref 11.6–15.2)

## 2015-05-09 LAB — T-HELPER CELLS (CD4) COUNT (NOT AT ARMC)
CD4 T CELL ABS: 940 /uL (ref 400–2700)
CD4 T CELL HELPER: 38 % (ref 33–55)

## 2015-05-09 LAB — APTT: APTT: 30 s (ref 24–37)

## 2015-05-09 LAB — TROPONIN I

## 2015-05-09 MED ORDER — METOCLOPRAMIDE HCL 5 MG/ML IJ SOLN: 10.0000 mg | Freq: Four times a day (QID) | INTRAMUSCULAR | Status: DC | PRN

## 2015-05-09 MED ORDER — SODIUM CHLORIDE 0.9 % IV SOLN
1000.0000 mL | Freq: Once | INTRAVENOUS | Status: AC
  Administered 2015-05-09: 1000 mL via INTRAVENOUS

## 2015-05-09 MED ORDER — NICOTINE 21 MG/24HR TD PT24
21.0000 mg | MEDICATED_PATCH | Freq: Every day | TRANSDERMAL | Status: DC
  Administered 2015-05-09 – 2015-05-10 (×2): 21 mg via TRANSDERMAL
  Filled 2015-05-09 (×2): qty 1

## 2015-05-09 MED ORDER — ONDANSETRON HCL 4 MG/2ML IJ SOLN
4.0000 mg | Freq: Once | INTRAMUSCULAR | Status: AC
  Administered 2015-05-09: 4 mg via INTRAVENOUS
  Filled 2015-05-09: qty 2

## 2015-05-09 MED ORDER — BISACODYL 10 MG RE SUPP
10.0000 mg | Freq: Every day | RECTAL | Status: DC
  Administered 2015-05-09 – 2015-05-10 (×2): 10 mg via RECTAL
  Filled 2015-05-09 (×2): qty 1

## 2015-05-09 MED ORDER — ASPIRIN 81 MG PO CHEW
324.0000 mg | CHEWABLE_TABLET | Freq: Every day | ORAL | Status: DC
  Administered 2015-05-09 – 2015-05-10 (×2): 324 mg via ORAL
  Filled 2015-05-09 (×3): qty 4

## 2015-05-09 MED ORDER — MORPHINE SULFATE (PF) 2 MG/ML IV SOLN
2.0000 mg | INTRAVENOUS | Status: DC | PRN
  Administered 2015-05-09 – 2015-05-10 (×4): 2 mg via INTRAVENOUS
  Filled 2015-05-09 (×4): qty 1

## 2015-05-09 MED ORDER — HEPARIN SODIUM (PORCINE) 5000 UNIT/ML IJ SOLN
5000.0000 [IU] | Freq: Three times a day (TID) | INTRAMUSCULAR | Status: DC
  Administered 2015-05-09 – 2015-05-10 (×4): 5000 [IU] via SUBCUTANEOUS
  Filled 2015-05-09 (×4): qty 1

## 2015-05-09 MED ORDER — ASPIRIN 81 MG PO CHEW
324.0000 mg | CHEWABLE_TABLET | Freq: Once | ORAL | Status: AC
  Administered 2015-05-09: 324 mg via ORAL
  Filled 2015-05-09: qty 4

## 2015-05-09 MED ORDER — LISINOPRIL 5 MG PO TABS
5.0000 mg | ORAL_TABLET | Freq: Every day | ORAL | Status: DC
  Administered 2015-05-09 – 2015-05-10 (×2): 5 mg via ORAL
  Filled 2015-05-09 (×2): qty 1

## 2015-05-09 MED ORDER — NITROGLYCERIN 0.4 MG SL SUBL: 0.4000 mg | SUBLINGUAL_TABLET | SUBLINGUAL | Status: DC | PRN

## 2015-05-09 MED ORDER — ONDANSETRON HCL 4 MG/2ML IJ SOLN: 4.0000 mg | Freq: Three times a day (TID) | INTRAMUSCULAR | Status: DC | PRN

## 2015-05-09 MED ORDER — ATORVASTATIN CALCIUM 40 MG PO TABS
40.0000 mg | ORAL_TABLET | Freq: Every day | ORAL | Status: DC
  Administered 2015-05-09: 40 mg via ORAL
  Filled 2015-05-09: qty 1

## 2015-05-09 MED ORDER — DM-GUAIFENESIN ER 30-600 MG PO TB12
1.0000 | ORAL_TABLET | Freq: Two times a day (BID) | ORAL | Status: DC
  Administered 2015-05-09 – 2015-05-10 (×3): 1 via ORAL
  Filled 2015-05-09 (×3): qty 1

## 2015-05-09 MED ORDER — MORPHINE SULFATE (PF) 2 MG/ML IV SOLN
2.0000 mg | Freq: Once | INTRAVENOUS | Status: AC
  Administered 2015-05-09: 2 mg via INTRAVENOUS
  Filled 2015-05-09: qty 1

## 2015-05-09 MED ORDER — PANTOPRAZOLE SODIUM 40 MG IV SOLR
40.0000 mg | INTRAVENOUS | Status: DC
  Administered 2015-05-09 – 2015-05-10 (×2): 40 mg via INTRAVENOUS
  Filled 2015-05-09 (×2): qty 40

## 2015-05-09 MED ORDER — METOCLOPRAMIDE HCL 5 MG/ML IJ SOLN
10.0000 mg | Freq: Once | INTRAMUSCULAR | Status: AC
  Administered 2015-05-09: 10 mg via INTRAVENOUS
  Filled 2015-05-09: qty 2

## 2015-05-09 MED ORDER — SODIUM CHLORIDE 0.9 % IV SOLN
1000.0000 mL | INTRAVENOUS | Status: DC
  Administered 2015-05-09 – 2015-05-10 (×3): 1000 mL via INTRAVENOUS

## 2015-05-09 MED ORDER — SODIUM CHLORIDE 0.9 % IJ SOLN
3.0000 mL | Freq: Two times a day (BID) | INTRAMUSCULAR | Status: DC
  Administered 2015-05-09: 3 mL via INTRAVENOUS

## 2015-05-09 NOTE — Progress Notes (Signed)
  Echocardiogram 2D Echocardiogram has been performed.  Delcie Roch 05/09/2015, 10:13 AM

## 2015-05-09 NOTE — Consult Note (Signed)
Primary Physician: Primary Cardiologist:  New  Asked to see re chest discomfort  HPI: Pt is a 49 yo with history of HTN, HIV, HTNand tobacco abuse, pancreatitis and noncomliance.  Admitted today with nausea, vomiting and chest tightness. N/V began at 3 AM  Also develped chest tightness and SOB  Had not had this before Prior to last night says he felt OK  No chest tightness/pain.  Currently without tightness  Breathing is OK  Mild abdominal discomfort.   Past Medical History  Diagnosis Date  . HIV (human immunodeficiency virus infection)   . Hypertension   . Back pain   . Syphilis   . Depression   . CKD (chronic kidney disease), stage II   . Pancreatitis     Medications Prior to Admission  Medication Sig Dispense Refill  . NORVIR 100 MG TABS tablet Take 100 mg by mouth daily.  3  . REYATAZ 300 MG capsule Take 300 mg by mouth daily.  3  . TRUVADA 200-300 MG per tablet Take 1 tablet by mouth daily.  3  . oxyCODONE-acetaminophen (PERCOCET/ROXICET) 5-325 MG per tablet Take 1 tablet by mouth every 4 (four) hours as needed for severe pain. (Patient not taking: Reported on 05/09/2015) 7 tablet 0     . aspirin  324 mg Oral Daily  . atorvastatin  40 mg Oral q1800  . dextromethorphan-guaiFENesin  1 tablet Oral BID  . heparin  5,000 Units Subcutaneous 3 times per day  . lisinopril  5 mg Oral Daily  . nicotine  21 mg Transdermal Daily  . pantoprazole (PROTONIX) IV  40 mg Intravenous Q24H  . sodium chloride  3 mL Intravenous Q12H    Infusions: . sodium chloride 1,000 mL (05/09/15 0405)    No Known Allergies  Social History   Social History  . Marital Status: Single    Spouse Name: N/A  . Number of Children: N/A  . Years of Education: N/A   Occupational History  . Not on file.   Social History Main Topics  . Smoking status: Current Every Day Smoker  . Smokeless tobacco: Not on file  . Alcohol Use: No  . Drug Use: No  . Sexual Activity: Not on file   Other  Topics Concern  . Not on file   Social History Narrative    Family History  Problem Relation Age of Onset  . Stroke Mother   . Heart attack Father   . Heart disease Brother   . Diabetes Brother   . Hypertension Brother     REVIEW OF SYSTEMS:  All systems reviewed  Negative to the above problem except as noted above.    PHYSICAL EXAM: Filed Vitals:   05/09/15 0632  BP: 138/80  Pulse: 61  Temp: 98.1 F (36.7 C)  Resp: 14     Intake/Output Summary (Last 24 hours) at 05/09/15 0813 Last data filed at 05/09/15 0532  Gross per 24 hour  Intake   1000 ml  Output    475 ml  Net    525 ml    General:  Well appearing. No respiratory difficulty HEENT: normal Neck: supple. no JVD. Carotids 2+ bilat; no bruits. No lymphadenopathy or thryomegaly appreciated. Cor: PMI nondisplaced. Regular rate & rhythm. No rubs, gallops or murmurs. Lungs: clear Abdomen: Sl distended  Mild discomfort  No rebound  No masses Extremities: no cyanosis, clubbing, rash, edema Neuro: alert & oriented x 3, cranial nerves grossly intact. moves all 4 extremities w/o  difficulty. Affect pleasant.  ECG:  SR  62  LVH with repolarization abnormality    Tele:  SB    Results for orders placed or performed during the hospital encounter of 05/09/15 (from the past 24 hour(s))  Comprehensive metabolic panel     Status: Abnormal   Collection Time: 05/09/15  3:46 AM  Result Value Ref Range   Sodium 140 135 - 145 mmol/L   Potassium 3.6 3.5 - 5.1 mmol/L   Chloride 106 101 - 111 mmol/L   CO2 26 22 - 32 mmol/L   Glucose, Bld 111 (H) 65 - 99 mg/dL   BUN 12 6 - 20 mg/dL   Creatinine, Ser 1.61 (H) 0.61 - 1.24 mg/dL   Calcium 8.9 8.9 - 09.6 mg/dL   Total Protein 7.4 6.5 - 8.1 g/dL   Albumin 3.9 3.5 - 5.0 g/dL   AST 35 15 - 41 U/L   ALT 36 17 - 63 U/L   Alkaline Phosphatase 39 38 - 126 U/L   Total Bilirubin 0.3 0.3 - 1.2 mg/dL   GFR calc non Af Amer 60 (L) >60 mL/min   GFR calc Af Amer >60 >60 mL/min   Anion gap  8 5 - 15  Lipase, blood     Status: None   Collection Time: 05/09/15  3:46 AM  Result Value Ref Range   Lipase 36 22 - 51 U/L  Troponin I     Status: None   Collection Time: 05/09/15  3:46 AM  Result Value Ref Range   Troponin I <0.03 <0.031 ng/mL  CBC with Differential     Status: Abnormal   Collection Time: 05/09/15  3:46 AM  Result Value Ref Range   WBC 6.2 4.0 - 10.5 K/uL   RBC 3.97 (L) 4.22 - 5.81 MIL/uL   Hemoglobin 12.4 (L) 13.0 - 17.0 g/dL   HCT 04.5 (L) 40.9 - 81.1 %   MCV 92.2 78.0 - 100.0 fL   MCH 31.2 26.0 - 34.0 pg   MCHC 33.9 30.0 - 36.0 g/dL   RDW 91.4 78.2 - 95.6 %   Platelets 169 150 - 400 K/uL   Neutrophils Relative % 54 43 - 77 %   Neutro Abs 3.4 1.7 - 7.7 K/uL   Lymphocytes Relative 33 12 - 46 %   Lymphs Abs 2.0 0.7 - 4.0 K/uL   Monocytes Relative 8 3 - 12 %   Monocytes Absolute 0.5 0.1 - 1.0 K/uL   Eosinophils Relative 5 0 - 5 %   Eosinophils Absolute 0.3 0.0 - 0.7 K/uL   Basophils Relative 0 0 - 1 %   Basophils Absolute 0.0 0.0 - 0.1 K/uL  Protime-INR     Status: None   Collection Time: 05/09/15  7:16 AM  Result Value Ref Range   Prothrombin Time 14.3 11.6 - 15.2 seconds   INR 1.09 0.00 - 1.49  APTT     Status: None   Collection Time: 05/09/15  7:16 AM  Result Value Ref Range   aPTT 30 24 - 37 seconds   Dg Abd Acute W/chest  05/09/2015   CLINICAL DATA:  Nausea and vomiting  EXAM: DG ABDOMEN ACUTE W/ 1V CHEST  COMPARISON:  Lumbar spine series of December 03, 2009  FINDINGS: The lungs are well-expanded and clear. The heart and pulmonary vascularity are normal. The mediastinum is normal in width. The trachea is midline.  Within the abdomen there are numerous loops of mildly distended gas and fluid-filled small bowel.  The gas and stool pattern within the colon is normal. There are no free extraluminal gas collections. There is stool and gas in the rectum. The bony structures are unremarkable.  IMPRESSION: 1. There is no acute cardiopulmonary abnormality. 2.  Small bowel ileus or partial distal small bowel obstruction. There is no evidence of perforation.   Electronically Signed   By: David  [place] M.D.   On: 05/09/2015 07:43     ASSESSMENT:  Chest tightness  I do not think pts chest tightness due to coronary ischemia.  Temporally related to N/V which appears to be GI Vivi Barrack with ileus).  He had not had problems with CP prior. EKG with evid of LVH  ST T wave changes are not diagnostic in this setting. I agree with echo to define anatomy/function Finish cycling enzymes.    2.  GI  Work up/ eval per primary team  3.  Tob  Pt smokes 3 to 4 cigs per day  COunselled on cessation.  4.  HIV  Noncompliant with meds    5.  HTN  FOllow BP  6.  HL   Would set up for fasting lipids    Dietrich Pates

## 2015-05-09 NOTE — ED Provider Notes (Signed)
CSN: [id number]     Arrival date & time 05/09/15  0243 History  This chart was scribed for Dione Booze, MD by Tanda Rockers, ED Scribe. This patient was seen in room B16C/B16C and the patient's care was started at 3:30 AM.      Chief Complaint  Patient presents with  . Nausea   The history is provided by the patient. No language interpreter was used.    HPI Comments: Eddie Malone is a 49 y.o. male who presents to the Emergency Department complaining of gradual onset nausea s/p eating chicken alfredo approximately 1-2 hours ago. Pt notes having chills, cold sweats, and pre syncope as well. He mentions that he began vomiting while in the ED. Pt also complains of a tightness in his chest. He denies diarrhea, shortness of breath, or any other associated symptoms. Positive Fhx of cardiac issues with mother and father that were in their 16's and 17's. Pt is current every day smoker that smokes 2-3 cigarettes per day.   Past Medical History  Diagnosis Date  . HIV (human immunodeficiency virus infection)   . Hypertension    No past surgical history on file. No family history on file. Social History  Substance Use Topics  . Smoking status: Current Every Day Smoker  . Smokeless tobacco: None  . Alcohol Use: No    Review of Systems  All other systems reviewed and are negative.  Allergies  Review of patient's allergies indicates no known allergies.  Home Medications   Prior to Admission medications   Medication Sig Start Date End Date Taking? Authorizing Provider  NORVIR 100 MG TABS tablet Take 100 mg by mouth daily. 03/27/15  Yes Historical Provider, MD  REYATAZ 300 MG capsule Take 300 mg by mouth daily. 03/27/15  Yes Historical Provider, MD  TRUVADA 200-300 MG per tablet Take 1 tablet by mouth daily. 03/27/15  Yes Historical Provider, MD  oxyCODONE-acetaminophen (PERCOCET/ROXICET) 5-325 MG per tablet Take 1 tablet by mouth every 4 (four) hours as needed for severe pain. Patient not  taking: Reported on 05/09/2015 04/04/15   Courteney Lyn Mackuen, MD   Triage Vitals: BP 148/91 mmHg  Pulse 84  Temp(Src) 98.2 F (36.8 C) (Oral)  Resp 16  SpO2 98%   Physical Exam  Constitutional: He is oriented to person, place, and time. He appears well-developed and well-nourished. No distress.  HENT:  Head: Normocephalic and atraumatic.  Eyes: EOM are normal. Pupils are equal, round, and reactive to light.  Neck: Normal range of motion. Neck supple. No JVD present.  Cardiovascular: Normal rate, regular rhythm and normal heart sounds.   No murmur heard. Pulmonary/Chest: Effort normal and breath sounds normal. He has no wheezes. He has no rales. He exhibits no tenderness.  Abdominal: Soft. He exhibits no distension and no mass. There is no tenderness.  Bowel sounds decreased  Musculoskeletal: Normal range of motion. He exhibits no edema or tenderness.  Lymphadenopathy:    He has no cervical adenopathy.  Neurological: He is alert and oriented to person, place, and time. No cranial nerve deficit. He exhibits normal muscle tone. Coordination normal.  Skin: Skin is warm and dry. No rash noted.  Psychiatric: He has a normal mood and affect. His behavior is normal. Judgment and thought content normal.  Nursing note and vitals reviewed.   ED Course  Procedures (including critical care time)  DIAGNOSTIC STUDIES: Oxygen Saturation is 98% on RA, normal by my interpretation.    COORDINATION OF CARE: 3:34 AM-Discussed  treatment plan which includes CMP, Lipase, Troponin, CBC with pt at bedside and pt agreed to plan.   Labs Review Results for orders placed or performed during the hospital encounter of 05/09/15  Comprehensive metabolic panel  Result Value Ref Range   Sodium 140 135 - 145 mmol/L   Potassium 3.6 3.5 - 5.1 mmol/L   Chloride 106 101 - 111 mmol/L   CO2 26 22 - 32 mmol/L   Glucose, Bld 111 (H) 65 - 99 mg/dL   BUN 12 6 - 20 mg/dL   Creatinine, Ser 1.61 (H) 0.61 - 1.24 mg/dL    Calcium 8.9 8.9 - 09.6 mg/dL   Total Protein 7.4 6.5 - 8.1 g/dL   Albumin 3.9 3.5 - 5.0 g/dL   AST 35 15 - 41 U/L   ALT 36 17 - 63 U/L   Alkaline Phosphatase 39 38 - 126 U/L   Total Bilirubin 0.3 0.3 - 1.2 mg/dL   GFR calc non Af Amer 60 (L) >60 mL/min   GFR calc Af Amer >60 >60 mL/min   Anion gap 8 5 - 15  Lipase, blood  Result Value Ref Range   Lipase 36 22 - 51 U/L  Troponin I  Result Value Ref Range   Troponin I <0.03 <0.031 ng/mL  CBC with Differential  Result Value Ref Range   WBC 6.2 4.0 - 10.5 K/uL   RBC 3.97 (L) 4.22 - 5.81 MIL/uL   Hemoglobin 12.4 (L) 13.0 - 17.0 g/dL   HCT 04.5 (L) 40.9 - 81.1 %   MCV 92.2 78.0 - 100.0 fL   MCH 31.2 26.0 - 34.0 pg   MCHC 33.9 30.0 - 36.0 g/dL   RDW 91.4 78.2 - 95.6 %   Platelets 169 150 - 400 K/uL   Neutrophils Relative % 54 43 - 77 %   Neutro Abs 3.4 1.7 - 7.7 K/uL   Lymphocytes Relative 33 12 - 46 %   Lymphs Abs 2.0 0.7 - 4.0 K/uL   Monocytes Relative 8 3 - 12 %   Monocytes Absolute 0.5 0.1 - 1.0 K/uL   Eosinophils Relative 5 0 - 5 %   Eosinophils Absolute 0.3 0.0 - 0.7 K/uL   Basophils Relative 0 0 - 1 %   Basophils Absolute 0.0 0.0 - 0.1 K/uL   I have personally reviewed and evaluated these lab results as part of my medical decision-making.   EKG Interpretation   Date/Time:  Friday May 09 2015 03:40:36 EDT Ventricular Rate:  62 PR Interval:  165 QRS Duration: 79 QT Interval:  400 QTC Calculation: 406 R Axis:   77 Text Interpretation:  Sinus rhythm Left ventricular hypertrophy Abnormal  T, consider ischemia, diffuse leads ST elevation, consider anterior injury  When compared with ECG of 03/26/2006, ST-t abnormality is more evident  Confirmed by [facility]  MD, Charli Liberatore ([id number]) on 05/09/2015 3:50:53 AM      MDM   Final diagnoses:  Chest discomfort  Nausea and vomiting, vomiting of unspecified type    Nausea and vomiting of uncertain cause. There is associated tightness in the chest and I'm concerned about  possibility of this being an anginal equivalent. He does have multiple risk factors for heart disease including very strong family history of premature coronary atherosclerosis. ECG is obtained showing left ventricular hypertrophy with repolarization changes slightly worse than prior ECG in 2007. He was given ondansetron with no relief. Because of ST elevation, I felt it was important to discuss this with cardiology. Case  is discussed with Dr. Antoine Poche, who has reviewed the ECG and feels that it does not indicate STEMI, merely progression of changes of left ventricular hypertrophy (this occurred at 4 AM). He is given metoclopramide and aspirin. Nitroglycerin was ordered but symptoms were completely resolved solely with metoclopramide. Decision was made to admit the patient for serial troponins. Initial troponin is normal. Case is discussed with Dr. Clyde Lundborg [facility] who agrees to admit the patient.   I personally performed the services described in this documentation, which was scribed in my presence. The recorded information has been reviewed and is accurate.       Dione Booze, MD 05/09/15 [id number]

## 2015-05-09 NOTE — Progress Notes (Signed)
Patient Demographics:    Eddie Malone, is a 49 y.o. male, DOB - 1965/09/19, ZOX:[id number]  Admit date - 05/09/2015   Admitting Physician Lorretta Harp, MD  Outpatient Primary MD for the patient is CHU, Brandon Melnick, MD  LOS - 0   Chief Complaint  Patient presents with  . Nausea        Subjective:    Eddie Malone today has, No headache, No chest pain, No abdominal pain - No Nausea, No new weakness tingling or numbness, No Cough - SOB.     Assessment  & Plan :     1. Nausea vomiting - could be mild ileus versus gastroenteritis. He is passing flatness had a BM yesterday. Abdominal exam is benign and he is pain-free. Do not think this is as below. Symptoms are much better with supportive care which included bowel rest, IV fluids, antiemetics. These will be continued. Will give a trial of clear liquids and monitor. Continue IV PPI.   2. HIV. Stopped taking medications 3 months ago as he says the medications were not agreeing with him, has an established ID physician out of town who he will follow post discharge. Viral load and T-cell count pending.   3. Nonspecific chest pain. Likely due to nausea vomiting with esophageal irritation, IV PPI, cycle troponin, echogram. Appreciate cardiology input. EKG changes most likely secondary to LVH. Symptom-free now.   4. CKD 3. Baseline creatinine is between 1.3-1.6. Hydrate, monitor.   5. Smoking. Counseled to quit. Nicotine patch.   6. DysLipidemia. Continue home dose statin.    Code Status : Full  Family Communication  : None present  Disposition Plan  : Home 1-2 days  Consults  :  None  Procedures  :    DVT Prophylaxis  :  Heparin    Lab Results  Component Value Date   PLT 169 05/09/2015    Inpatient Medications  Scheduled Meds: .  aspirin  324 mg Oral Daily  . atorvastatin  40 mg Oral q1800  . bisacodyl  10 mg Rectal Daily  . dextromethorphan-guaiFENesin  1 tablet Oral BID  . heparin  5,000 Units Subcutaneous 3 times per day  . lisinopril  5 mg Oral Daily  . nicotine  21 mg Transdermal Daily  . pantoprazole (PROTONIX) IV  40 mg Intravenous Q24H  . sodium chloride  3 mL Intravenous Q12H   Continuous Infusions: . sodium chloride 1,000 mL (05/09/15 0405)   PRN Meds:.metoCLOPramide (REGLAN) injection, morphine injection, nitroGLYCERIN, ondansetron (ZOFRAN) IV  Antibiotics  :    Anti-infectives    None        Objective:   Filed Vitals:   05/09/15 0500 05/09/15 0515 05/09/15 0530 05/09/15 0632  BP: 131/74 138/82 137/80 138/80  Pulse: 56 52 57 61  Temp:    98.1 F (36.7 C)  TempSrc:    Oral  Resp: Height:     (1.753 m)  Weight:    79.606 kg (175 lb 8 oz)  SpO2: 98% 99% 99% 100%    Wt Readings from Last 3 Encounters:  05/09/15 79.606 kg (175 lb 8 oz)  04/04/15 81.647 kg (180 lb)  07/16/10 83.462 kg (184 lb)  Intake/Output Summary (Last 24 hours) at 05/09/15 1016 Last data filed at 05/09/15 0856  Gross per 24 hour  Intake   1000 ml  Output    775 ml  Net    225 ml     Physical Exam  Awake Alert, Oriented X 3, No new F.N deficits, Normal affect [place].AT,PERRAL Supple Neck,No JVD, No cervical lymphadenopathy appriciated.  Symmetrical Chest wall movement, Good air movement bilaterally, CTAB RRR,No Gallops,Rubs or new Murmurs, No Parasternal Heave +ve B.Sounds, Abd Soft, No tenderness, No organomegaly appriciated, No rebound - guarding or rigidity. No Cyanosis, Clubbing or edema, No new Rash or bruise       Data Review:   Micro Results No results found for this or any previous visit (from the past 240 hour(s)).  Radiology Reports Dg Abd Acute W/chest  05/09/2015   CLINICAL DATA:  Nausea and vomiting  EXAM: DG ABDOMEN ACUTE W/ 1V CHEST  COMPARISON:  Lumbar spine series  of December 03, 2009  FINDINGS: The lungs are well-expanded and clear. The heart and pulmonary vascularity are normal. The mediastinum is normal in width. The trachea is midline.  Within the abdomen there are numerous loops of mildly distended gas and fluid-filled small bowel. The gas and stool pattern within the colon is normal. There are no free extraluminal gas collections. There is stool and gas in the rectum. The bony structures are unremarkable.  IMPRESSION: 1. There is no acute cardiopulmonary abnormality. 2. Small bowel ileus or partial distal small bowel obstruction. There is no evidence of perforation.   Electronically Signed   By: David  [place] M.D.   On: 05/09/2015 07:43     CBC  Recent Labs Lab 05/09/15 0346  WBC 6.2  HGB 12.4*  HCT 36.6*  PLT 169  MCV 92.2  MCH 31.2  MCHC 33.9  RDW 11.9  LYMPHSABS 2.0  MONOABS 0.5  EOSABS 0.3  BASOSABS 0.0    Chemistries   Recent Labs Lab 05/09/15 0346  NA 140  K 3.6  CL 106  CO2 26  GLUCOSE 111*  BUN 12  CREATININE 1.38*  CALCIUM 8.9  AST 35  ALT 36  ALKPHOS 39  BILITOT 0.3   ------------------------------------------------------------------------------------------------------------------ estimated creatinine clearance is 66.9 mL/min (by C-G formula based on Cr of 1.38). ------------------------------------------------------------------------------------------------------------------ No results for input(s): HGBA1C in the last 72 hours. ------------------------------------------------------------------------------------------------------------------ No results for input(s): CHOL, HDL, LDLCALC, TRIG, CHOLHDL, LDLDIRECT in the last 72 hours. ------------------------------------------------------------------------------------------------------------------ No results for input(s): TSH, T4TOTAL, T3FREE, THYROIDAB in the last 72 hours.  Invalid input(s):  FREET3 ------------------------------------------------------------------------------------------------------------------ No results for input(s): VITAMINB12, FOLATE, FERRITIN, TIBC, IRON, RETICCTPCT in the last 72 hours.  Coagulation profile  Recent Labs Lab 05/09/15 0716  INR 1.09    No results for input(s): DDIMER in the last 72 hours.  Cardiac Enzymes  Recent Labs Lab 05/09/15 0346 05/09/15 0716  TROPONINI <0.03 <0.03   ------------------------------------------------------------------------------------------------------------------ Invalid input(s): POCBNP   Time Spent in minutes   35   Eddie Malone K M.D on 05/09/2015 at 10:16 AM  Between 7am to 7pm - Pager - [phone]  After 7pm go to [email or web address] - password [facility]  [facility] -  Office  [phone]

## 2015-05-09 NOTE — ED Notes (Signed)
Pt. reports nausea with chills after eating supper ( chicken) this evening , denies abdominal pain or diarrhea . No fever or chills.

## 2015-05-09 NOTE — H&P (Addendum)
[facility] History and Physical  Eddie Malone WUJ:[id number] DOB: June 25, 1966 DOA: 05/09/2015  Referring physician: ED physician PCP: Natasha Mead, MD  Specialists:   Chief Complaint: Nausea, vomiting, chest tightness  HPI: Eddie Malone is a 49 y.o. male with PMH of tobacco abuse, hypertension, depression, syphilis, pancreatitis, HIV, medication noncompliance,CKD-II, who resents with the nausea, vomiting, chest tightness.  Patient reports that he started having nausea and vomiting at about 3 AM soon after he eat chicken alfredo. Pt notes having chills, cold sweats, and almost passed out, but did not. He vomited 5 times without blood in the vomitus. He has mild abdominal pain over epigastric area, which is 5 out of 10 in severity, nonradiating. He does not have diarrhea and fever. Pt also complains of  tightness in his chest and SOB. He has cough with yellow sputum production which she attributs to smoking. Patient does not have symptoms of UTI, rashes or lateral weakness.  In ED, patient was found to have lipase 36, negative troponin, WBC 6.2, temperature normal, no tachycardia, stable renal function. Patient is admitted to inpatient for further evaluation and treatment.  Where does patient live?   At home    Can patient participate in ADLs?  Yes    Review of Systems:   General: no fevers, has chills, no changes in body weight, has poor appetite, has fatigue HEENT: no blurry vision, hearing changes or sore throat Pulm: has dyspnea, coughing, no wheezing CV: has chest tightness, no palpitations Abd: has nausea, vomiting, abdominal pain, no diarrhea, constipation GU: no dysuria, burning on urination, increased urinary frequency, hematuria  Ext: no leg edema Neuro: no unilateral weakness, numbness, or tingling, no vision change or hearing loss Skin: no rash MSK: No muscle spasm, no deformity, no limitation of range of movement in spin Heme: No easy bruising.  Travel  history: No recent long distant travel.  Allergy: No Known Allergies  Past Medical History  Diagnosis Date  . HIV (human immunodeficiency virus infection)   . Hypertension   . Back pain   . Syphilis   . Depression   . CKD (chronic kidney disease), stage II   . Pancreatitis     No past surgical history on file.  Social History:  reports that he has been smoking.  He does not have any smokeless tobacco history on file. He reports that he does not drink alcohol or use illicit drugs.  Family History:  Family History  Problem Relation Age of Onset  . Stroke Mother   . Heart attack Father   . Heart disease Brother   . Diabetes Brother   . Hypertension Brother      Prior to Admission medications   Medication Sig Start Date End Date Taking? Authorizing Provider  NORVIR 100 MG TABS tablet Take 100 mg by mouth daily. 03/27/15  Yes Historical Provider, MD  REYATAZ 300 MG capsule Take 300 mg by mouth daily. 03/27/15  Yes Historical Provider, MD  TRUVADA 200-300 MG per tablet Take 1 tablet by mouth daily. 03/27/15  Yes Historical Provider, MD  oxyCODONE-acetaminophen (PERCOCET/ROXICET) 5-325 MG per tablet Take 1 tablet by mouth every 4 (four) hours as needed for severe pain. Patient not taking: Reported on 05/09/2015 04/04/15   Courteney Lyn Mackuen, MD    Physical Exam: Filed Vitals:   05/09/15 0445 05/09/15 0500 05/09/15 0515 05/09/15 0530  BP: 131/78 131/74 138/82 137/80  Pulse: 55 56 52 57  Temp:      TempSrc:  Resp: 20 16 19 16   SpO2: 100% 98% 99% 99%   General: Not in acute distress HEENT:       Eyes: PERRL, EOMI, no scleral icterus.       ENT: No discharge from the ears and nose, no pharynx injection, no tonsillar enlargement.        Neck: No JVD, no bruit, no mass felt. Heme: No neck lymph node enlargement. Cardiac: S1/S2, RRR, No murmurs, No gallops or rubs. Pulm:  No rales, wheezing, rhonchi or rubs. Abd: Soft, nondistended, tenderness over epigastric area, no  rebound pain, no organomegaly, BS present. Ext: No pitting leg edema bilaterally. 2+DP/PT pulse bilaterally. Musculoskeletal: No joint deformities, No joint redness or warmth, no limitation of ROM in spin. Skin: No rashes.  Neuro: Alert, oriented X3, cranial nerves II-XII grossly intact, muscle strength 5/5 in all extremities, sensation to light touch intact.  Psych: Patient is not psychotic, no suicidal or hemocidal ideation.  Labs on Admission:  Basic Metabolic Panel:  Recent Labs Lab 05/09/15 0346  NA 140  K 3.6  CL 106  CO2 26  GLUCOSE 111*  BUN 12  CREATININE 1.38*  CALCIUM 8.9   Liver Function Tests:  Recent Labs Lab 05/09/15 0346  AST 35  ALT 36  ALKPHOS 39  BILITOT 0.3  PROT 7.4  ALBUMIN 3.9    Recent Labs Lab 05/09/15 0346  LIPASE 36   No results for input(s): AMMONIA in the last 168 hours. CBC:  Recent Labs Lab 05/09/15 0346  WBC 6.2  NEUTROABS 3.4  HGB 12.4*  HCT 36.6*  MCV 92.2  PLT 169   Cardiac Enzymes:  Recent Labs Lab 05/09/15 0346  TROPONINI <0.03    BNP (last 3 results) No results for input(s): BNP in the last 8760 hours.  ProBNP (last 3 results) No results for input(s): PROBNP in the last 8760 hours.  CBG: No results for input(s): GLUCAP in the last 168 hours.  Radiological Exams on Admission: No results found.  EKG: Independently reviewed.  Abnormal findings: T-wave inversion in inferior disease and V5- V6. J point elevation in V1-V4. Dr. Preston Fleeting discussed with Card, Dr. Antoine Poche, who has reviewed the ECG and feels that it does not indicate STEMI, merely progression of changes of left ventricular hypertrophy.  Assessment/Plan Principal Problem:   Chest tightness Active Problems:   Human immunodeficiency virus (HIV) disease   Essential hypertension, benign   Back pain   Nausea & vomiting   CKD (chronic kidney disease), stage II  Chest tightness: given his abnormal EKG and risk factors including hypertension, HIV and  CKD, will need to rule out ACS. - will admit to Tele bed  - cycle CE q6 x3 and repeat her EKG in the am  - Nitroglycerin, Morphine, and aspirin, lipitor  - Risk factor stratification: will check FLP and A1C, UDS - 2d echo -Called card PA in AM, will see pt today.  Nausea, vomiting and AP: likely due to viral gastritis vs. food poison. Lipase negative. -will get X-ray -acute abd/chest -IVF: 1L ns and then 100 cc/h -when necessary Zofran for nausea, morphine for pain -Protonix IV  Human immunodeficiency virus (HIV) disease: patient was followed up by ID, Dr. Deno Etienne in [facility]. Patient states that he has not been taking his HIV medications for more than 3 month since HIV medications make him feel sick. -will check HIV VL and CD4 -hold HIV meds -He has an appointment with Dr. Deno Etienne on 05/15/15. Encouraged him not  to miss the appointment.  Essential hypertension, benign: used to take lisinopril, but seems not be taking it currently. Bp is 145/71-169/99. -Resume lisinopril, 5 mg daily.  CKD-II: stable. Baseline Cre is 1.3-1.6, his Cre is 1.38 which is at baseline. - IVF as above - Follow up renal function by BMP  DVT ppx: SQ Heparin   Code Status: Full code Family Communication: None at bed side. Disposition Plan: Admit to inpatient   Date of Service 05/09/2015    Lorretta Harp [facility] Pager [phone]  If 7PM-7AM, please contact night-coverage [email or web address] Password [email or web address] 05/09/2015, 6:08 AM

## 2015-05-10 LAB — CBC
HCT: 36.4 % — ABNORMAL LOW (ref 39.0–52.0)
Hemoglobin: 12.2 g/dL — ABNORMAL LOW (ref 13.0–17.0)
MCH: 31 pg (ref 26.0–34.0)
MCHC: 33.5 g/dL (ref 30.0–36.0)
MCV: 92.6 fL (ref 78.0–100.0)
PLATELETS: 155 10*3/uL (ref 150–400)
RBC: 3.93 MIL/uL — ABNORMAL LOW (ref 4.22–5.81)
RDW: 11.8 % (ref 11.5–15.5)
WBC: 5.4 10*3/uL (ref 4.0–10.5)

## 2015-05-10 LAB — HEMOGLOBIN A1C
HEMOGLOBIN A1C: 5.2 % (ref 4.8–5.6)
MEAN PLASMA GLUCOSE: 103 mg/dL

## 2015-05-10 LAB — URINE CULTURE: Culture: 3000

## 2015-05-10 LAB — LIPID PANEL
CHOLESTEROL: 125 mg/dL (ref 0–200)
HDL: 29 mg/dL — AB (ref >40)
LDL CALC: 75 mg/dL (ref 0–99)
TRIGLYCERIDES: 105 mg/dL (ref <150)
Total CHOL/HDL Ratio: 4.3 RATIO
VLDL: 21 mg/dL (ref 0–40)

## 2015-05-10 LAB — BASIC METABOLIC PANEL
Anion gap: 4 — ABNORMAL LOW (ref 5–15)
BUN: 8 mg/dL (ref 6–20)
CALCIUM: 8.5 mg/dL — AB (ref 8.9–10.3)
CO2: 27 mmol/L (ref 22–32)
CREATININE: 1.24 mg/dL (ref 0.61–1.24)
Chloride: 107 mmol/L (ref 101–111)
GFR calc Af Amer: >60 mL/min (ref >60)
GLUCOSE: 88 mg/dL (ref 65–99)
Potassium: 4.1 mmol/L (ref 3.5–5.1)
SODIUM: 138 mmol/L (ref 135–145)

## 2015-05-10 NOTE — Discharge Instructions (Signed)
Follow with Primary MD Deno Etienne, Brandon Melnick, MD in 7 days   Get CBC, CMP, 2 view Chest X ray checked  by Primary MD next visit.    Activity: As tolerated with Full fall precautions use walker/cane & assistance as needed   Disposition Home    Diet: Heart Healthy  For Heart failure patients - Check your Weight same time everyday, if you gain over 2 pounds, or you develop in leg swelling, experience more shortness of breath or chest pain, call your Primary MD immediately. Follow Cardiac Low Salt Diet and 1.5 lit/day fluid restriction.   On your next visit with your primary care physician please Get Medicines reviewed and adjusted.   Please request your Prim.MD to go over all Hospital Tests and Procedure/Radiological results at the follow up, please get all Hospital records sent to your Prim MD by signing hospital release before you go home.   If you experience worsening of your admission symptoms, develop shortness of breath, life threatening emergency, suicidal or homicidal thoughts you must seek medical attention immediately by calling 911 or calling your MD immediately  if symptoms less severe.  You Must read complete instructions/literature along with all the possible adverse reactions/side effects for all the Medicines you take and that have been prescribed to you. Take any new Medicines after you have completely understood and accpet all the possible adverse reactions/side effects.   Do not drive, operating heavy machinery, perform activities at heights, swimming or participation in water activities or provide baby sitting services if your were admitted for syncope or siezures until you have seen by Primary MD or a Neurologist and advised to do so again.  Do not drive when taking Pain medications.    Do not take more than prescribed Pain, Sleep and Anxiety Medications  Special Instructions: If you have smoked or chewed Tobacco  in the last 2 yrs please stop smoking, stop any regular  Alcohol  and or any Recreational drug use.  Wear Seat belts while driving.   Please note  You were cared for by a hospitalist during your hospital stay. If you have any questions about your discharge medications or the care you received while you were in the hospital after you are discharged, you can call the unit and asked to speak with the hospitalist on call if the hospitalist that took care of you is not available. Once you are discharged, your primary care physician will handle any further medical issues. Please note that NO REFILLS for any discharge medications will be authorized once you are discharged, as it is imperative that you return to your primary care physician (or establish a relationship with a primary care physician if you do not have one) for your aftercare needs so that they can reassess your need for medications and monitor your lab values.

## 2015-05-10 NOTE — Discharge Summary (Signed)
Eddie Malone, is a 49 y.o. male  DOB [birth year]  MRN [id number].  Admission date:  05/09/2015  Admitting Physician  Lorretta Harp, MD  Discharge Date:  05/10/2015   Primary MD  Deno Etienne, Brandon Melnick, MD  Recommendations for primary care physician for things to follow:   Monitor blood pressure, compliance with HIV medications closely.   Admission Diagnosis  Chest discomfort [R07.89] CKD (chronic kidney disease), stage II [N18.2] Nausea and vomiting, vomiting of unspecified type [R11.2]   Discharge Diagnosis  Chest discomfort [R07.89] CKD (chronic kidney disease), stage II [N18.2] Nausea and vomiting, vomiting of unspecified type [R11.2]     Principal Problem:   Chest tightness Active Problems:   Human immunodeficiency virus (HIV) disease   Essential hypertension, benign   Back pain   Nausea & vomiting   CKD (chronic kidney disease), stage II   Chest discomfort      Past Medical History  Diagnosis Date  . HIV (human immunodeficiency virus infection)   . Hypertension   . Back pain   . Syphilis   . Depression   . CKD (chronic kidney disease), stage II   . Pancreatitis     No past surgical history on file.     HPI  from the history and physical done on the day of admission:   Eddie Malone is a 49 y.o. male with PMH of tobacco abuse, hypertension, depression, syphilis, pancreatitis, HIV, medication noncompliance,CKD-II, who resents with the nausea, vomiting, chest tightness.  Patient reports that he started having nausea and vomiting at about 3 AM soon after he eat chicken alfredo. Pt notes having chills, cold sweats, and almost passed out, but did not. He vomited 5 times without blood in the vomitus. He has mild abdominal pain over epigastric area, which is 5 out of 10 in severity, nonradiating. He  does not have diarrhea and fever. Pt also complains of tightness in his chest and SOB. He has cough with yellow sputum production which she attributs to smoking. Patient does not have symptoms of UTI, rashes or lateral weakness.  In ED, patient was found to have lipase 36, negative troponin, WBC 6.2, temperature normal, no tachycardia, stable renal function. Patient is admitted to inpatient for further evaluation and treatment.     Hospital Course:     1. Nausea vomiting - could be mild ileus versus gastroenteritis. He is passing flatness had a BM yesterday. Abdominal exam is benign and he is pain-free. Do not think this is as below. Symptoms have resolved after supportive care which included bowel rest, IV fluids, antiemetics. And a bowel movement this morning, has tolerated diet yesterday evening and this morning, completely symptom free eager to go home. He will be discharged later this morning.   2. HIV. Stopped taking medications 3 months ago as he says the medications were not agreeing with him, has an established ID physician out of town who he will follow post discharge. Pending viral load but stable T cell count, requested to follow with his  HIV physician in the next 3-4 days which she will do. He has a pending appointment on coming Thursday.   3. Nonspecific chest pain. Likely due to nausea vomiting with esophageal irritation, IV PPI, cycle troponin, echogram with stable EF 60% and no wall motion abnormality. Appreciate cardiology input, cleared by cardiology to go home. EKG changes most likely secondary to LVH. Symptom-free now.   4. CKD 3. Baseline creatinine is between 1.3-1.6. Remained stable with gentle hydration.   5. Smoking. Counseled to quit. Nicotine patch.   6. DysLipidemia. Continue home dose statin.       Discharge Condition: Stable  Follow UP  Follow-up Information    Follow up with CHU, Brandon Melnick, MD. Schedule an appointment as soon as possible for a  visit in 3 days.   Specialty:  Infectious Diseases   Contact information:   [facility] [address] [place] [address] [phone]        Consults obtained - Cards  Diet and Activity recommendation: See Discharge Instructions below  Discharge Instructions       Discharge Instructions    Diet - low sodium heart healthy    Complete by:  As directed      Discharge instructions    Complete by:  As directed   Follow with Primary MD CHU, Brandon Melnick, MD in 7 days   Get CBC, CMP, 2 view Chest X ray checked  by Primary MD next visit.    Activity: As tolerated with Full fall precautions use walker/cane & assistance as needed   Disposition Home    Diet: Heart Healthy  For Heart failure patients - Check your Weight same time everyday, if you gain over 2 pounds, or you develop in leg swelling, experience more shortness of breath or chest pain, call your Primary MD immediately. Follow Cardiac Low Salt Diet and 1.5 lit/day fluid restriction.   On your next visit with your primary care physician please Get Medicines reviewed and adjusted.   Please request your Prim.MD to go over all Hospital Tests and Procedure/Radiological results at the follow up, please get all Hospital records sent to your Prim MD by signing hospital release before you go home.   If you experience worsening of your admission symptoms, develop shortness of breath, life threatening emergency, suicidal or homicidal thoughts you must seek medical attention immediately by calling 911 or calling your MD immediately  if symptoms less severe.  You Must read complete instructions/literature along with all the possible adverse reactions/side effects for all the Medicines you take and that have been prescribed to you. Take any new Medicines after you have completely understood and accpet all the possible adverse reactions/side effects.   Do not drive, operating heavy machinery, perform activities at heights, swimming or  participation in water activities or provide baby sitting services if your were admitted for syncope or siezures until you have seen by Primary MD or a Neurologist and advised to do so again.  Do not drive when taking Pain medications.    Do not take more than prescribed Pain, Sleep and Anxiety Medications  Special Instructions: If you have smoked or chewed Tobacco  in the last 2 yrs please stop smoking, stop any regular Alcohol  and or any Recreational drug use.  Wear Seat belts while driving.   Please note  You were cared for by a hospitalist during your hospital stay. If you have any questions about your discharge medications or the care you received while you were in  the hospital after you are discharged, you can call the unit and asked to speak with the hospitalist on call if the hospitalist that took care of you is not available. Once you are discharged, your primary care physician will handle any further medical issues. Please note that NO REFILLS for any discharge medications will be authorized once you are discharged, as it is imperative that you return to your primary care physician (or establish a relationship with a primary care physician if you do not have one) for your aftercare needs so that they can reassess your need for medications and monitor your lab values.     Increase activity slowly    Complete by:  As directed              Discharge Medications       Medication List    TAKE these medications        NORVIR 100 MG Tabs tablet  Generic drug:  ritonavir  Take 100 mg by mouth daily.     oxyCODONE-acetaminophen 5-325 MG per tablet  Commonly known as:  PERCOCET/ROXICET  Take 1 tablet by mouth every 4 (four) hours as needed for severe pain.     REYATAZ 300 MG capsule  Generic drug:  atazanavir  Take 300 mg by mouth daily.     TRUVADA 200-300 MG per tablet  Generic drug:  emtricitabine-tenofovir  Take 1 tablet by mouth daily.        Major procedures  and Radiology Reports - PLEASE review detailed and final reports for all details, in brief -   TTE   - Left ventricle: The cavity size was normal. Systolic function was normal. The estimated ejection fraction was in the range of 60% to 65%. Wall motion was normal; there were no regional wall motion abnormalities. - Aortic valve: Trileaflet; normal thickness, mildly calcified leaflets. - Mitral valve: There was trivial regurgitation. - Pulmonic valve: There was trivial regurgitation  Dg Abd Acute W/chest  05/09/2015   CLINICAL DATA:  Nausea and vomiting  EXAM: DG ABDOMEN ACUTE W/ 1V CHEST  COMPARISON:  Lumbar spine series of December 03, 2009  FINDINGS: The lungs are well-expanded and clear. The heart and pulmonary vascularity are normal. The mediastinum is normal in width. The trachea is midline.  Within the abdomen there are numerous loops of mildly distended gas and fluid-filled small bowel. The gas and stool pattern within the colon is normal. There are no free extraluminal gas collections. There is stool and gas in the rectum. The bony structures are unremarkable.  IMPRESSION: 1. There is no acute cardiopulmonary abnormality. 2. Small bowel ileus or partial distal small bowel obstruction. There is no evidence of perforation.   Electronically Signed   By: David  [place] M.D.   On: 05/09/2015 07:43    Micro Results      No results found for this or any previous visit (from the past 240 hour(s)).     Today   Subjective    Eddie Malone today has no headache,no chest abdominal pain,no new weakness tingling or numbness, feels much better wants to go home today.     Objective   Blood pressure 139/70, pulse 76, temperature 98.1 F (36.7 C), temperature source Oral, resp. rate 18, height 5\' 9"  (1.753 m), weight 78.654 kg (173 lb 6.4 oz), SpO2 98 %.   Intake/Output Summary (Last 24 hours) at 05/10/15 0902 Last data filed at 05/10/15 0600  Gross per 24 hour  Intake   2820  ml    Output   1150 ml  Net   1670 ml    Exam Awake Alert, Oriented x 3, No new F.N deficits, Normal affect .AT,PERRAL Supple Neck,No JVD, No cervical lymphadenopathy appriciated.  Symmetrical Chest wall movement, Good air movement bilaterally, CTAB RRR,No Gallops,Rubs or new Murmurs, No Parasternal Heave +ve B.Sounds, Abd Soft, Non tender, No organomegaly appriciated, No rebound -guarding or rigidity. No Cyanosis, Clubbing or edema, No new Rash or bruise   Data Review   CBC w Diff: Lab Results  Component Value Date   WBC 5.4 05/10/2015   HGB 12.2* 05/10/2015   HCT 36.4* 05/10/2015   PLT 155 05/10/2015   LYMPHOPCT 33 05/09/2015   MONOPCT 8 05/09/2015   EOSPCT 5 05/09/2015   BASOPCT 0 05/09/2015    CMP: Lab Results  Component Value Date   NA 138 05/10/2015   K 4.1 05/10/2015   CL 107 05/10/2015   CO2 27 05/10/2015   BUN 8 05/10/2015   CREATININE 1.24 05/10/2015   PROT 7.4 05/09/2015   ALBUMIN 3.9 05/09/2015   BILITOT 0.3 05/09/2015   ALKPHOS 39 05/09/2015   AST 35 05/09/2015   ALT 36 05/09/2015  .   Total Time in preparing paper work, data evaluation and todays exam - 35 minutes  Leroy Sea M.D on 05/10/2015 at 9:02 AM  [facility]   Office  [phone]

## 2015-05-10 NOTE — Progress Notes (Signed)
Subjective:  No recurrent chest pain overnight.  Objective:  Vital Signs in the last 24 hours: BP 139/70 mmHg  Pulse 76  Temp(Src) 98.1 F (36.7 C) (Oral)  Resp 18  Ht  (1.753 m)  Wt 78.654 kg (173 lb 6.4 oz)  BMI 25.60 kg/m2  SpO2 98%  Physical Exam: Pleasant black male in no acute distress Lungs:  Clear Cardiac:  Regular rhythm, normal S1 and S2, no S3 Extremities:  No edema present  Intake/Output from previous day: 08/26 0701 - 08/27 0700 In: 2820 [P.O.:1320; I.V.:1500] Out: 1450 [Urine:1450]  Weight Filed Weights   05/09/15 4098 05/10/15 0548  Weight: 79.606 kg (175 lb 8 oz) 78.654 kg (173 lb 6.4 oz)    Lab Results: Basic Metabolic Panel:  Recent Labs  11/91/47 0346 05/10/15 0425  NA 140 138  K 3.6 4.1  CL 106 107  CO2 26 27  GLUCOSE 111* 88  BUN 12 8  CREATININE 1.38* 1.24   CBC:  Recent Labs  05/09/15 0346 05/10/15 0425  WBC 6.2 5.4  NEUTROABS 3.4  --   HGB 12.4* 12.2*  HCT 36.6* 36.4*  MCV 92.2 92.6  PLT 169 155   Cardiac Enzymes:  Cardiac Panel (last 3 results)  Recent Labs  05/09/15 0716 05/09/15 1255 05/09/15 1832  TROPONINI <0.03 <0.03 <0.03    Telemetry: Sinus rhythm  Assessment/Plan: 1.  Atypical chest pain that has resolved 2.  Hypertension 3.  HIV  Recommendations:  Enzymes are negative and his echo shows good systolic function.  No further cardiovascular workup necessary.     Darden Palmer  MD [facility]  05/10/2015, 9:38 AM

## 2015-05-12 LAB — HIV-1 RNA QUANT-NO REFLEX-BLD
HIV 1 RNA Quant: 11580 copies/mL
LOG10 HIV-1 RNA: 4.064 log10copy/mL

## 2016-02-16 IMAGING — DX DG ABDOMEN ACUTE W/ 1V CHEST
3 series · 3 of 3 positions shown · non-contrast
Comparison: Lumbar spine series of December 03, 2009

CLINICAL DATA: Nausea and vomiting

EXAM:
DG ABDOMEN ACUTE W/ 1V CHEST

[chest pa]
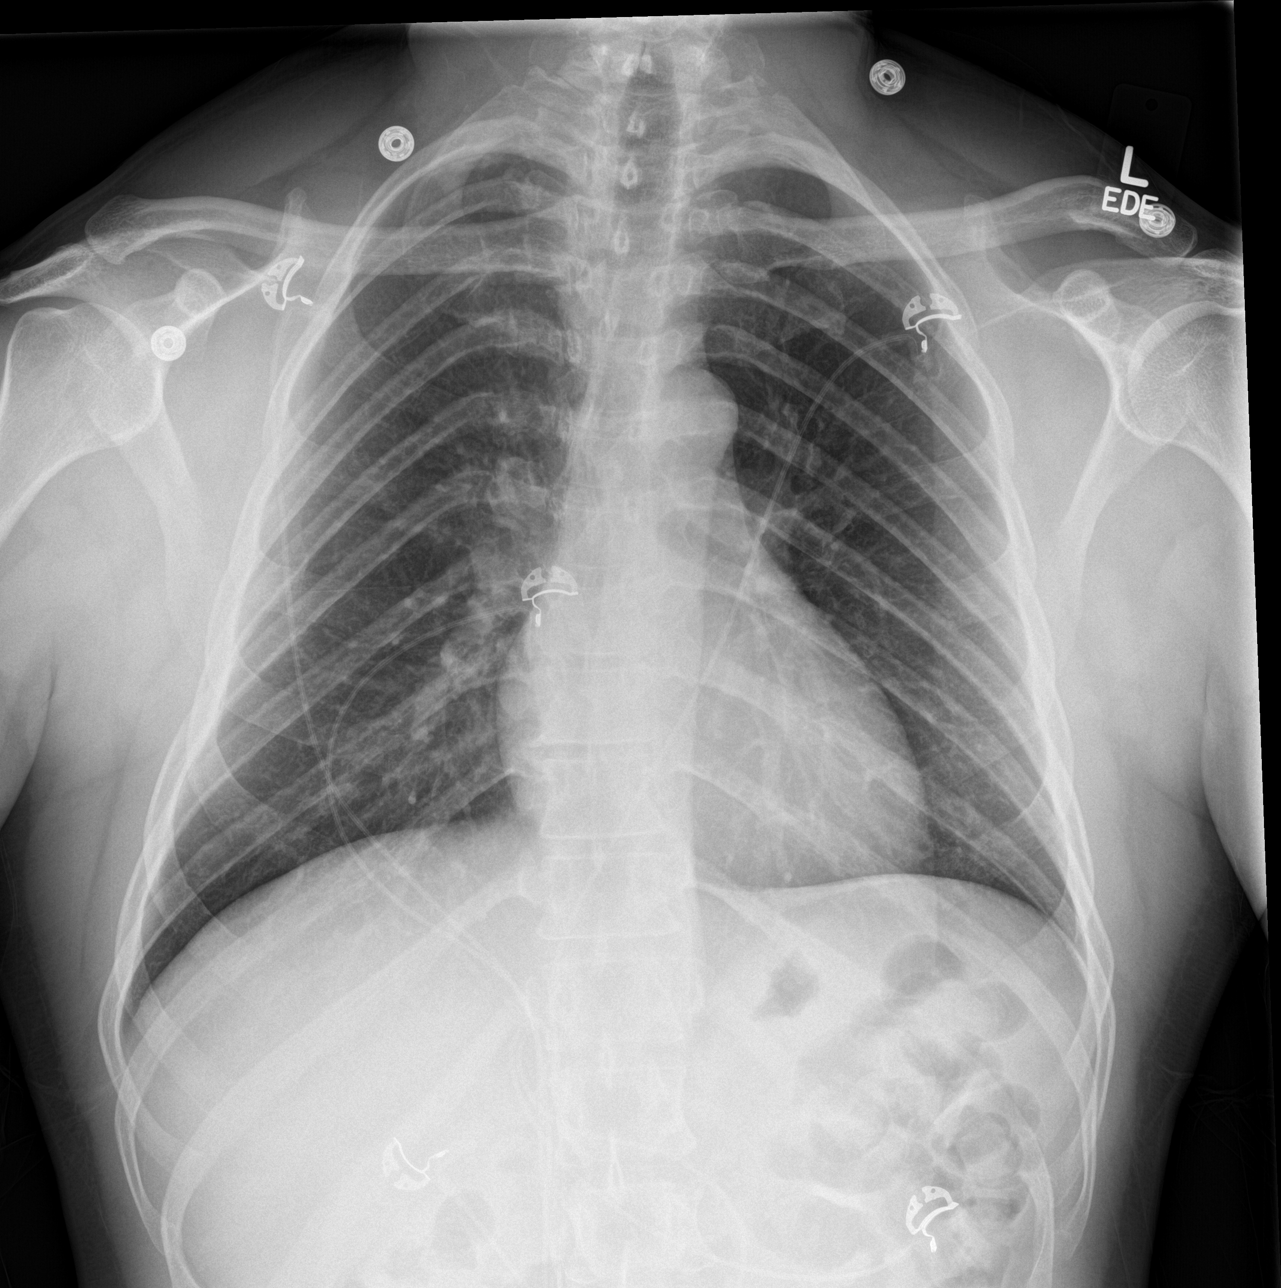

[abdomen erect]
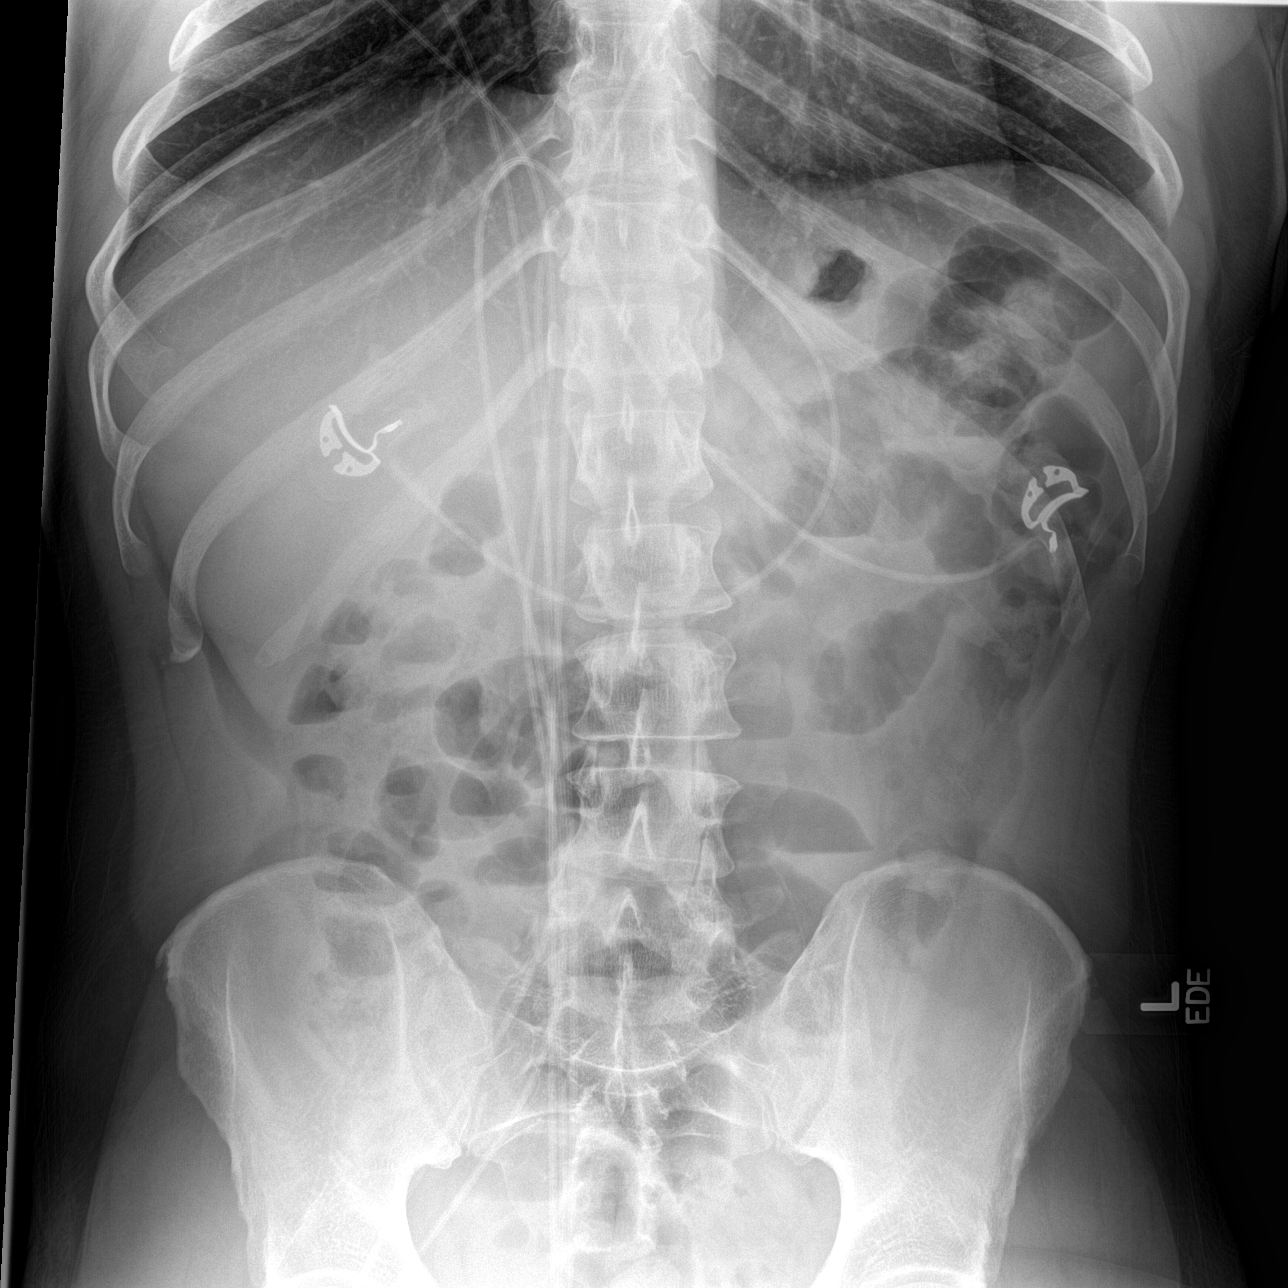

[abdomen supine]
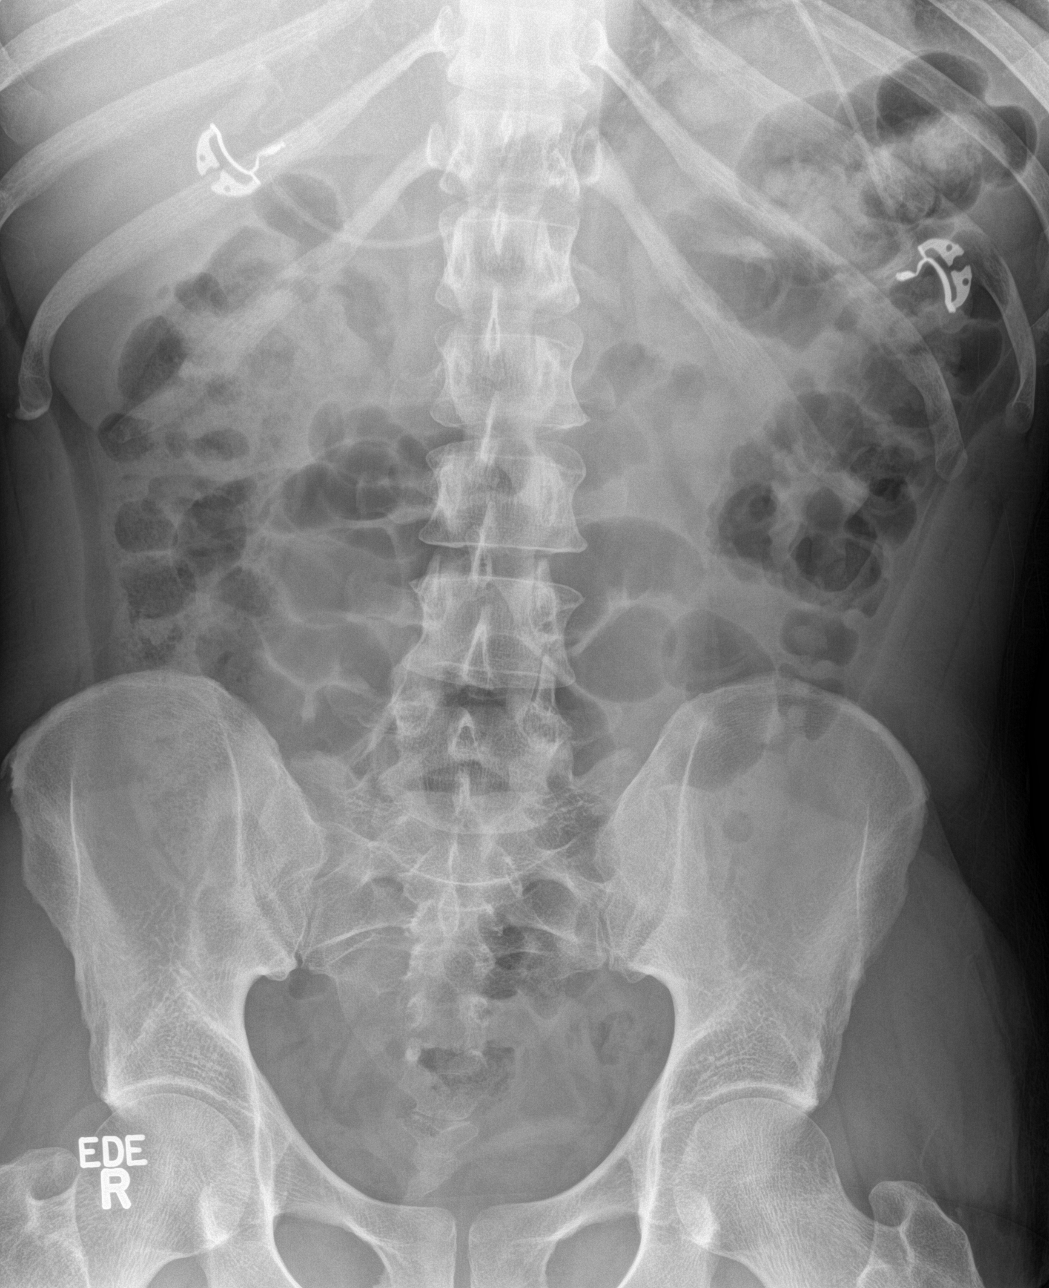

[3 of 3 positions shown; findings below may reference images not displayed]

FINDINGS: The lungs are well-expanded and clear. The heart and pulmonary
vascularity are normal. The mediastinum is normal in width. The
trachea is midline.

Within the abdomen there are numerous loops of mildly distended gas
and fluid-filled small bowel. The gas and stool pattern within the
colon is normal. There are no free extraluminal gas collections.
There is stool and gas in the rectum. The bony structures are
unremarkable.
IMPRESSION: 1. There is no acute cardiopulmonary abnormality.
2. Small bowel ileus or partial distal small bowel obstruction.
There is no evidence of perforation.
